# Patient Record
Sex: Female | Born: 1945 | Race: White | Hispanic: No | Marital: Married | Smoking: Current every day smoker
Health system: Southern US, Community
[De-identification: ages and names within clinical notes are randomized; demographics above are authoritative.]

## PROBLEM LIST (undated history)

## (undated) DIAGNOSIS — I10 Essential (primary) hypertension: Secondary | ICD-10-CM

## (undated) DIAGNOSIS — E039 Hypothyroidism, unspecified: Secondary | ICD-10-CM

## (undated) HISTORY — PX: CHOLECYSTECTOMY: SHX55

---

## 2011-03-17 ENCOUNTER — Ambulatory Visit: Payer: Self-pay | Admitting: Internal Medicine

## 2011-03-30 ENCOUNTER — Ambulatory Visit: Payer: Self-pay | Admitting: Internal Medicine

## 2012-04-25 ENCOUNTER — Ambulatory Visit: Payer: Self-pay | Admitting: Internal Medicine

## 2012-04-28 ENCOUNTER — Ambulatory Visit: Payer: Self-pay | Admitting: Internal Medicine

## 2012-08-08 ENCOUNTER — Ambulatory Visit: Payer: Self-pay | Admitting: Internal Medicine

## 2012-11-15 ENCOUNTER — Ambulatory Visit: Payer: Self-pay | Admitting: Unknown Physician Specialty

## 2013-05-10 ENCOUNTER — Ambulatory Visit: Payer: Self-pay | Admitting: Internal Medicine

## 2013-05-19 ENCOUNTER — Ambulatory Visit: Payer: Self-pay | Admitting: Unknown Physician Specialty

## 2013-10-25 ENCOUNTER — Ambulatory Visit: Payer: Self-pay

## 2013-11-09 ENCOUNTER — Ambulatory Visit: Payer: Self-pay

## 2014-01-24 DIAGNOSIS — E0520 Thyrotoxicosis with toxic multinodular goiter without thyrotoxic crisis or storm: Secondary | ICD-10-CM | POA: Insufficient documentation

## 2014-04-04 DIAGNOSIS — E890 Postprocedural hypothyroidism: Secondary | ICD-10-CM | POA: Insufficient documentation

## 2014-05-22 ENCOUNTER — Ambulatory Visit: Payer: Self-pay | Admitting: Internal Medicine

## 2014-11-20 ENCOUNTER — Other Ambulatory Visit: Payer: Self-pay | Admitting: Internal Medicine

## 2014-11-20 DIAGNOSIS — E0520 Thyrotoxicosis with toxic multinodular goiter without thyrotoxic crisis or storm: Secondary | ICD-10-CM

## 2014-11-27 ENCOUNTER — Other Ambulatory Visit: Payer: Self-pay | Admitting: Internal Medicine

## 2014-11-27 ENCOUNTER — Encounter
Admission: RE | Admit: 2014-11-27 | Discharge: 2014-11-27 | Disposition: A | Discharge status: Home / Self Care | Payer: Medicare Other | Source: Ambulatory Visit | Attending: Internal Medicine | Admitting: Internal Medicine

## 2014-11-27 ENCOUNTER — Ambulatory Visit
Admission: RE | Admit: 2014-11-27 | Discharge: 2014-11-27 | Disposition: A | Discharge status: Home / Self Care | Payer: Medicare Other | Source: Ambulatory Visit | Attending: Internal Medicine | Admitting: Internal Medicine

## 2014-11-27 ENCOUNTER — Ambulatory Visit: Admission: RE | Admit: 2014-11-27 | Payer: Medicare Other | Source: Ambulatory Visit

## 2014-11-27 DIAGNOSIS — E0520 Thyrotoxicosis with toxic multinodular goiter without thyrotoxic crisis or storm: Secondary | ICD-10-CM

## 2014-11-27 MED ORDER — SODIUM IODIDE I-123 7.4 MBQ PO CAPS
154.8100 | ORAL_CAPSULE | Freq: Once | ORAL | Status: AC
  Administered 2014-11-27: 154.81 via ORAL

## 2014-11-28 ENCOUNTER — Encounter
Admission: RE | Admit: 2014-11-28 | Discharge: 2014-11-28 | Disposition: A | Discharge status: Home / Self Care | Payer: Medicare Other | Source: Ambulatory Visit | Attending: Internal Medicine | Admitting: Internal Medicine

## 2014-11-30 ENCOUNTER — Encounter
Admission: RE | Admit: 2014-11-30 | Discharge: 2014-11-30 | Disposition: A | Discharge status: Home / Self Care | Payer: Medicare Other | Source: Ambulatory Visit | Attending: Internal Medicine | Admitting: Internal Medicine

## 2014-11-30 DIAGNOSIS — E0520 Thyrotoxicosis with toxic multinodular goiter without thyrotoxic crisis or storm: Secondary | ICD-10-CM | POA: Insufficient documentation

## 2014-11-30 MED ORDER — SODIUM IODIDE I 131 CAPSULE
34.9300 | Freq: Once | INTRAVENOUS | Status: AC | PRN
  Administered 2014-11-30: 34.93 via ORAL

## 2015-04-21 DEATH — deceased

## 2016-09-08 ENCOUNTER — Other Ambulatory Visit: Payer: Self-pay | Admitting: Internal Medicine

## 2016-09-08 DIAGNOSIS — Z1231 Encounter for screening mammogram for malignant neoplasm of breast: Secondary | ICD-10-CM

## 2016-10-01 ENCOUNTER — Ambulatory Visit
Admission: RE | Admit: 2016-10-01 | Discharge: 2016-10-01 | Disposition: A | Discharge status: Home / Self Care | Payer: Medicare Other | Source: Ambulatory Visit | Attending: Internal Medicine | Admitting: Internal Medicine

## 2016-10-01 DIAGNOSIS — Z1231 Encounter for screening mammogram for malignant neoplasm of breast: Secondary | ICD-10-CM | POA: Diagnosis not present

## 2017-12-22 ENCOUNTER — Other Ambulatory Visit: Payer: Self-pay | Admitting: Physician Assistant

## 2017-12-22 DIAGNOSIS — R221 Localized swelling, mass and lump, neck: Secondary | ICD-10-CM

## 2017-12-28 ENCOUNTER — Ambulatory Visit
Admission: RE | Admit: 2017-12-28 | Discharge: 2017-12-28 | Disposition: A | Discharge status: Home / Self Care | Payer: Medicare Other | Source: Ambulatory Visit | Attending: Physician Assistant | Admitting: Physician Assistant

## 2017-12-28 DIAGNOSIS — R220 Localized swelling, mass and lump, head: Secondary | ICD-10-CM | POA: Insufficient documentation

## 2017-12-28 DIAGNOSIS — R221 Localized swelling, mass and lump, neck: Secondary | ICD-10-CM

## 2017-12-28 DIAGNOSIS — D110 Benign neoplasm of parotid gland: Secondary | ICD-10-CM | POA: Insufficient documentation

## 2017-12-28 HISTORY — DX: Essential (primary) hypertension: I10

## 2017-12-28 LAB — POCT I-STAT CREATININE: Creatinine, Ser: 0.5 mg/dL (ref 0.44–1.00)

## 2017-12-28 MED ORDER — IOPAMIDOL (ISOVUE-300) INJECTION 61%
75.0000 mL | Freq: Once | INTRAVENOUS | Status: AC | PRN
  Administered 2017-12-28: 75 mL via INTRAVENOUS

## 2017-12-30 ENCOUNTER — Other Ambulatory Visit: Payer: Self-pay | Admitting: Physician Assistant

## 2017-12-30 DIAGNOSIS — D110 Benign neoplasm of parotid gland: Secondary | ICD-10-CM

## 2017-12-30 DIAGNOSIS — R220 Localized swelling, mass and lump, head: Secondary | ICD-10-CM

## 2018-01-04 ENCOUNTER — Other Ambulatory Visit: Payer: Self-pay | Admitting: Physician Assistant

## 2018-01-04 ENCOUNTER — Ambulatory Visit
Admission: RE | Admit: 2018-01-04 | Discharge: 2018-01-04 | Disposition: A | Discharge status: Home / Self Care | Payer: Medicare Other | Source: Ambulatory Visit | Attending: Physician Assistant | Admitting: Physician Assistant

## 2018-01-04 DIAGNOSIS — K118 Other diseases of salivary glands: Secondary | ICD-10-CM | POA: Insufficient documentation

## 2018-01-04 DIAGNOSIS — Z803 Family history of malignant neoplasm of breast: Secondary | ICD-10-CM | POA: Diagnosis not present

## 2018-01-04 DIAGNOSIS — F17200 Nicotine dependence, unspecified, uncomplicated: Secondary | ICD-10-CM | POA: Insufficient documentation

## 2018-01-04 DIAGNOSIS — Z79899 Other long term (current) drug therapy: Secondary | ICD-10-CM | POA: Insufficient documentation

## 2018-01-04 DIAGNOSIS — D110 Benign neoplasm of parotid gland: Secondary | ICD-10-CM | POA: Diagnosis not present

## 2018-01-04 DIAGNOSIS — I10 Essential (primary) hypertension: Secondary | ICD-10-CM | POA: Diagnosis not present

## 2018-01-04 DIAGNOSIS — R220 Localized swelling, mass and lump, head: Secondary | ICD-10-CM | POA: Diagnosis present

## 2018-01-04 MED ORDER — FENTANYL CITRATE (PF) 100 MCG/2ML IJ SOLN
INTRAMUSCULAR | Status: AC
  Filled 2018-01-04: qty 2

## 2018-01-04 MED ORDER — MIDAZOLAM HCL 5 MG/5ML IJ SOLN
INTRAMUSCULAR | Status: AC
  Filled 2018-01-04: qty 5

## 2018-01-04 MED ORDER — MIDAZOLAM HCL 2 MG/2ML IJ SOLN
INTRAMUSCULAR | Status: AC | PRN
  Administered 2018-01-04 (×2): 1 mg via INTRAVENOUS

## 2018-01-04 MED ORDER — FENTANYL CITRATE (PF) 100 MCG/2ML IJ SOLN
INTRAMUSCULAR | Status: AC | PRN
  Administered 2018-01-04: 50 ug via INTRAVENOUS
  Administered 2018-01-04: 25 ug via INTRAVENOUS

## 2018-01-04 MED ORDER — SODIUM CHLORIDE 0.9 % IV SOLN
INTRAVENOUS | Status: DC
  Administered 2018-01-04: 08:00:00 via INTRAVENOUS

## 2018-01-04 NOTE — Discharge Instructions (Signed)
Moderate Conscious Sedation, Adult, Care After °These instructions provide you with information about caring for yourself after your procedure. Your health care provider may also give you more specific instructions. Your treatment has been planned according to current medical practices, but problems sometimes occur. Call your health care provider if you have any problems or questions after your procedure. °What can I expect after the procedure? °After your procedure, it is common: °· To feel sleepy for several hours. °· To feel clumsy and have poor balance for several hours. °· To have poor judgment for several hours. °· To vomit if you eat too soon. ° °Follow these instructions at home: °For at least 24 hours after the procedure: ° °· Do not: °? Participate in activities where you could fall or become injured. °? Drive. °? Use heavy machinery. °? Drink alcohol. °? Take sleeping pills or medicines that cause drowsiness. °? Make important decisions or sign legal documents. °? Take care of children on your own. °· Rest. °Eating and drinking °· Follow the diet recommended by your health care provider. °· If you vomit: °? Drink water, juice, or soup when you can drink without vomiting. °? Make sure you have little or no nausea before eating solid foods. °General instructions °· Have a responsible adult stay with you until you are awake and alert. °· Take over-the-counter and prescription medicines only as told by your health care provider. °· If you smoke, do not smoke without supervision. °· Keep all follow-up visits as told by your health care provider. This is important. °Contact a health care provider if: °· You keep feeling nauseous or you keep vomiting. °· You feel light-headed. °· You develop a rash. °· You have a fever. °Get help right away if: °· You have trouble breathing. °This information is not intended to replace advice given to you by your health care provider. Make sure you discuss any questions you have  with your health care provider. °Document Released: 01/25/2013 Document Revised: 09/09/2015 Document Reviewed: 07/27/2015 °Elsevier Interactive Patient Education © 2018 [employer] ° ° °Needle Biopsy, Care After °These instructions give you information about caring for yourself after your procedure. Your doctor may also give you more specific instructions. Call your doctor if you have any problems or questions after your procedure. °Follow these instructions at home: °· Rest as told by your doctor. °· Take medicines only as told by your doctor. °· There are many different ways to close and cover the biopsy site, including stitches (sutures), skin glue, and adhesive strips. Follow instructions from your doctor about: °? How to take care of your biopsy site. °? When and how you should change your bandage (dressing). °? When you should remove your dressing. °? Removing whatever was used to close your biopsy site. °· Check your biopsy site every day for signs of infection. Watch for: °? Redness, swelling, or pain. °? Fluid, blood, or pus. °Contact a doctor if: °· You have a fever. °· You have redness, swelling, or pain at the biopsy site, and it lasts longer than a few days. °· You have fluid, blood, or pus coming from the biopsy site. °· You feel sick to your stomach (nauseous). °· You throw up (vomit). °Get help right away if: °· You are short of breath. °· You have trouble breathing. °· Your chest hurts. °· You feel dizzy or you pass out (faint). °· You have bleeding that does not stop with pressure or a bandage. °· You cough up blood. °·   Your belly (abdomen) hurts. °This information is not intended to replace advice given to you by your health care provider. Make sure you discuss any questions you have with your health care provider. °Document Released: 03/19/2008 Document Revised: 09/12/2015 Document Reviewed: 04/02/2014 °Elsevier Interactive Patient Education © 2018 [employer] ° °

## 2018-01-04 NOTE — Procedures (Signed)
Interventional Radiology Procedure Note  Procedure: US guided FNA of left submandibular gland mass and right parotid gland masses x 2  Complications: None  Estimated Blood Loss: < 10 mL  Findings: Left submandibular: 1.5 cm greatest diam, hypoechoic mass.  25 G FNA x 4 Right parotid #1: 1.9 cm greatest diam., hypoechoic.  25 G FNA x 4 Right parotid #2: 1.1 cm greatest diam., hypoechoic. 25 G FNA x 4.  Venetia Night. Kathlene Cote, M.D Pager:  [phone]

## 2018-01-04 NOTE — H&P (Signed)
Chief Complaint: Patient was seen in consultation today for right parotid and left submandibular salivary gland biopsy at the request of Brooks,Jennifer Ann  Referring Physician(s): Arturo Morton  Patient Status: [facility] - Out-pt  History of Present Illness: Darlene Grimes is a 72 y.o. female with a palpable left submandibular gland mass.  Also states history of palpable nodules in region of right parotid gland since about 2015.  CT of neck on 12/28/17 demonstrated 1.6 cm left submandibular gland mass and 1.2 and 1.3 cm right parotid gland nodules. Not painful or inflamed. No history of salivary calculi or surgery.  Has had prior sinus surgery. No history of malignancy.  Current smoker.  Past Medical History:  Diagnosis Date  . Hypertension     No past surgical history on file.  Allergies: Patient has no known allergies.  Medications: Prior to Admission medications   Medication Sig Start Date End Date Taking? Authorizing Provider  losartan (COZAAR) 50 MG tablet Take 50 mg by mouth daily.   Yes [provider]     Family History  Problem Relation Age of Onset  . Breast cancer Mother 82  . Breast cancer Maternal Aunt        50's    Social History   Socioeconomic History  . Marital status: Married    Spouse name: Not on file  . Number of children: Not on file  . Years of education: Not on file  . Highest education level: Not on file  Occupational History  . Not on file  Social Needs  . Financial resource strain: Not on file  . Food insecurity:    Worry: Not on file    Inability: Not on file  . Transportation needs:    Medical: Not on file    Non-medical: Not on file  Tobacco Use  . Smoking status: Current Every Day Smoker  . Smokeless tobacco: Never Used  Substance and Sexual Activity  . Alcohol use: Not on file  . Drug use: Not on file  . Sexual activity: Not on file  Lifestyle  . Physical activity:    Days per week: Not on file   Minutes per session: Not on file  . Stress: Not on file  Relationships  . Social connections:    Talks on phone: Not on file    Gets together: Not on file    Attends religious service: Not on file    Active member of club or organization: Not on file    Attends meetings of clubs or organizations: Not on file    Relationship status: Not on file  Other Topics Concern  . Not on file  Social History Narrative  . Not on file    Review of Systems: A 12 point ROS discussed and pertinent positives are indicated in the HPI above.  All other systems are negative.  Review of Systems  Constitutional: Negative.   HENT: Negative.   Respiratory: Negative.   Cardiovascular: Negative.   Gastrointestinal: Negative.   Genitourinary: Negative.   Musculoskeletal: Negative.   Neurological: Negative.     Vital Signs: BP (!) 149/68   Pulse 71   Temp 98 F (36.7 C) (Oral)   Resp (!) 71   Ht 5\' 1"  (1.549 m)   Wt 59 kg   SpO2 96%   BMI 24.56 kg/m   Physical Exam  Constitutional: She is oriented to person, place, and time. She appears well-developed and well-nourished. No distress.  HENT:  Head: Normocephalic  and atraumatic.  Neck: Neck supple. No JVD present. No tracheal deviation present. No thyromegaly present.  Palpable firm nodularity of left submandibular gland.  Difficult to palpate right parotid gland lesions.  Cardiovascular: Normal rate, regular rhythm and normal heart sounds. Exam reveals no gallop and no friction rub.  No murmur heard. Pulmonary/Chest: Effort normal and breath sounds normal. No stridor. No respiratory distress. She has no wheezes. She has no rales.  Abdominal: Soft. Bowel sounds are normal. She exhibits no distension and no mass. There is no tenderness. There is no rebound and no guarding.  Musculoskeletal: She exhibits no edema.  Lymphadenopathy:    She has no cervical adenopathy.  Neurological: She is alert and oriented to person, place, and time.  Skin: Skin  is warm and dry. She is not diaphoretic.  Vitals reviewed.   Imaging: Ct Soft Tissue Neck W Contrast  Result Date: 12/28/2017 CLINICAL DATA:  Left submandibular swelling for 2-3 weeks. Remote right parotid surgery. History of smoking. EXAM: CT NECK WITH CONTRAST TECHNIQUE: Multidetector CT imaging of the neck was performed using the standard protocol following the bolus administration of intravenous contrast. CONTRAST:  27mL ISOVUE-300 IOPAMIDOL (ISOVUE-300) INJECTION 61% COMPARISON:  None. FINDINGS: Pharynx and larynx: No mass. No retropharyngeal fluid. Salivary glands: 16 x 11 x 15 mm hypoenhancing region in the left submandibular gland most is consistent with a mass. No salivary calculi, ductal dilatation, or acute inflammatory changes are identified. Enhancing masses in the superficial aspect of the right parotid gland measure 12 x 8 mm and 13 x 12 mm with suggestion of small cystic areas in the larger mass. The right submandibular and left parotid glands are unremarkable. Thyroid: Diffusely heterogeneous thyroid gland with small calcifications bilaterally and no discrete dominant nodule. Lymph nodes: No enlarged or suspicious lymph nodes in the neck. Vascular: Thoracic aortic atherosclerosis. Average right subclavian artery. Moderate calcific atherosclerosis at the left greater than right carotid bifurcations without evidence of flow limiting ICA stenosis. Limited intracranial: Unremarkable. Visualized orbits: Bilateral cataract extraction. Mastoids and visualized paranasal sinuses: Clear. Skeleton: Mild cervical disc and facet degeneration. No suspicious osseous lesion. Upper chest: Mild centrilobular emphysema in the lung apices. Asymmetric pleural-parenchymal scarring in the left lung apex including a 1.7 x 1.0 cm nodular subpleural density (series 4, image 21). Other: None. IMPRESSION: 1. 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 2. Two right parotid masses  measuring up to 13 mm also compatible with primary salivary neoplasm, potentially Warthin's tumors given multiplicity and smoking history. 3. No evidence of cervical lymphadenopathy. 4. Asymmetric pleural-parenchymal scarring in the left lung apex including a 1.4 cm nodule. Chest CT or PET-CT is recommended in 3 months to assess stability. This recommendation follows the consensus statement: Guidelines for Management of Incidental Pulmonary Nodules Detected on CT Images: From the Fleischner Society 2017; Radiology 2017; 284:228-243. Electronically Signed   By: Logan Bores M.D.   On: 12/28/2017 11:03    Labs:  CBC: No results for input(s): WBC, HGB, HCT, PLT in the last 8760 hours.  COAGS: No results for input(s): INR, APTT in the last 8760 hours.  BMP: Recent Labs    12/28/17 0854  CREATININE 0.50    Assessment and Plan:  For US guided FNA of left submandibular and right parotid gland lesions x 2 today.  Will administer conscious sedation given unusual 3 lesion salivary biopsy which will lengthen procedural time.  Risks and benefits discussed with the patient including, but not  limited to bleeding, infection, damage to adjacent structures or low yield requiring additional tests. All of the patient's questions were answered, patient is agreeable to proceed. Consent signed and in chart.  Thank you for this interesting consult.  I greatly enjoyed meeting Aryiana Klinkner and look forward to participating in their care.  A copy of this report was sent to the requesting provider on this date.  Electronically Signed: Azzie Roup, MD 01/04/2018, 8:31 AM   I spent a total of 30 Minutes in face to face in clinical consultation, greater than 50% of which was counseling/coordinating care for bilateral salivary gland mass biopsy.

## 2018-01-06 LAB — CYTOLOGY - NON PAP

## 2018-01-12 ENCOUNTER — Other Ambulatory Visit: Payer: Self-pay | Admitting: Internal Medicine

## 2018-01-12 DIAGNOSIS — G3184 Mild cognitive impairment, so stated: Secondary | ICD-10-CM

## 2018-01-17 ENCOUNTER — Ambulatory Visit
Admission: RE | Admit: 2018-01-17 | Discharge: 2018-01-17 | Disposition: A | Discharge status: Home / Self Care | Payer: Medicare Other | Source: Ambulatory Visit | Attending: Internal Medicine | Admitting: Internal Medicine

## 2018-01-17 DIAGNOSIS — G3184 Mild cognitive impairment, so stated: Secondary | ICD-10-CM | POA: Insufficient documentation

## 2018-01-17 DIAGNOSIS — I6523 Occlusion and stenosis of bilateral carotid arteries: Secondary | ICD-10-CM | POA: Diagnosis not present

## 2018-01-26 ENCOUNTER — Encounter
Admission: RE | Admit: 2018-01-26 | Discharge: 2018-01-26 | Disposition: A | Discharge status: Home / Self Care | Payer: Medicare Other | Source: Ambulatory Visit | Attending: Unknown Physician Specialty | Admitting: Unknown Physician Specialty

## 2018-01-26 ENCOUNTER — Other Ambulatory Visit: Payer: Self-pay

## 2018-01-26 DIAGNOSIS — I10 Essential (primary) hypertension: Secondary | ICD-10-CM | POA: Diagnosis not present

## 2018-01-26 DIAGNOSIS — R9431 Abnormal electrocardiogram [ECG] [EKG]: Secondary | ICD-10-CM | POA: Diagnosis not present

## 2018-01-26 DIAGNOSIS — Z01818 Encounter for other preprocedural examination: Secondary | ICD-10-CM | POA: Diagnosis present

## 2018-01-26 HISTORY — DX: Hypothyroidism, unspecified: E03.9

## 2018-01-26 LAB — BASIC METABOLIC PANEL
Anion gap: 10 (ref 5–15)
BUN: 6 mg/dL — AB (ref 8–23)
CO2: 31 mmol/L (ref 22–32)
CREATININE: 0.62 mg/dL (ref 0.44–1.00)
Calcium: 9 mg/dL (ref 8.9–10.3)
Chloride: 92 mmol/L — ABNORMAL LOW (ref 98–111)
GFR calc Af Amer: >60 mL/min (ref >60)
Glucose, Bld: 84 mg/dL (ref 70–99)
Potassium: 3 mmol/L — ABNORMAL LOW (ref 3.5–5.1)
SODIUM: 133 mmol/L — AB (ref 135–145)

## 2018-01-26 LAB — CBC
HCT: 44.8 % (ref 36.0–46.0)
Hemoglobin: 15.7 g/dL — ABNORMAL HIGH (ref 12.0–15.0)
MCH: 32.3 pg (ref 26.0–34.0)
MCHC: 35 g/dL (ref 30.0–36.0)
MCV: 92.2 fL (ref 80.0–100.0)
PLATELETS: 307 10*3/uL (ref 150–400)
RBC: 4.86 MIL/uL (ref 3.87–5.11)
RDW: 13.2 % (ref 11.5–15.5)
WBC: 7.7 10*3/uL (ref 4.0–10.5)
nRBC: 0 % (ref 0.0–0.2)

## 2018-01-26 NOTE — Patient Instructions (Addendum)
  Your procedure is scheduled on: Tuesday February 01, 2018 Report to Same Day Surgery 2nd floor medical mall ([place] Entrance-take elevator on left to 2nd floor.  Check in with surgery information desk.) To find out your arrival time please call [phone] between 1PM - 3PM on Monday January 31, 2018  Remember: Instructions that are not followed completely may result in serious medical risk, up to and including death, or upon the discretion of your surgeon and anesthesiologist your surgery may need to be rescheduled.    _x___ 1. Do not eat food (including mints, candies, chewing gum) after midnight the night before your procedure. You may drink clear liquids up to 2 hours before you are scheduled to arrive at the hospital for your procedure.  Do not drink clear liquids within 2 hours of your scheduled arrival to the hospital.  Clear liquids include  --Water or Apple juice without pulp  --Clear carbohydrate beverage such as Gatorade  --Black Coffee or Clear Tea (No milk, no creamers, do not add anything to the coffee or tea)    __x__ 2. No Alcohol for 24 hours before or after surgery.   __x__ 3. No Smoking or e-cigarettes for 24 prior to surgery.  Do not use any chewable tobacco products for at least 6 hour prior to surgery   __x__ 4. Notify your doctor if there is any change in your medical condition (cold, fever, infections).   __x__ 5. On the morning of surgery brush your teeth with toothpaste and water.  You may rinse your mouth with mouth wash if you wish.  Do not swallow any toothpaste or mouthwash.   __x__ Use antibacterial soap such as Dial to shower/bathe on the day of surgery.   Do not wear jewelry, make-up, hairpins, clips or nail polish.  Do not wear lotions, powders, deodorant, or perfumes.   Do not shave below the face/neck 48 hours prior to surgery.   Do not bring valuables to the hospital.    [facility] is not responsible for any belongings or valuables.           Contacts, dentures or bridgework may not be worn into surgery.  For patients discharged on the day of surgery, you will NOT be permitted to drive yourself home.  _x___ Take anti-hypertensive listed below, cardiac, seizure, asthma, anti-reflux and psychiatric medicines. These include:  1. Levothyroxine (Synthroid)    Do NOT take your Losartan-HCTZ (Hyzaar) on the morning of surgery.  _x___ Follow recommendations from Cardiologist, Pulmonologist or PCP regarding stopping Aspirin, Coumadin, Plavix ,Eliquis, Effient, or Pradaxa, and Pletal.  _x___ NOW: Stop Anti-inflammatories such as Advil, Ibuprofen, Motrin, Naproxen, Naprosyn, Aleve, Goodies powders or aspirin products. OK to take Tylenol and Celebrex.   _x___ NOW: Stop supplements until after surgery.  But may continue Vitamin D, Vitamin B, and multivitamin.

## 2018-01-26 NOTE — Pre-Procedure Instructions (Signed)
BMP results faxed to Dr. Ileene Hutchinson office.  I-stat ordered for DOS.

## 2018-01-26 NOTE — Care Management (Signed)
EKG REVIEWED.  Poor R wave progression, otherwise OK. No cardiac symptoms. No further testing required.

## 2018-02-01 ENCOUNTER — Ambulatory Visit
Admission: RE | Admit: 2018-02-01 | Discharge: 2018-02-01 | Disposition: A | Discharge status: Home / Self Care | Payer: Medicare Other | Source: Ambulatory Visit | Attending: Unknown Physician Specialty | Admitting: Unknown Physician Specialty

## 2018-02-01 ENCOUNTER — Encounter: Payer: Self-pay | Admitting: *Deleted

## 2018-02-01 ENCOUNTER — Ambulatory Visit: Payer: Medicare Other | Admitting: Anesthesiology

## 2018-02-01 ENCOUNTER — Encounter
Admission: RE | Disposition: A | Discharge status: Home / Self Care | Payer: Self-pay | Source: Ambulatory Visit | Attending: Unknown Physician Specialty

## 2018-02-01 ENCOUNTER — Other Ambulatory Visit: Payer: Self-pay

## 2018-02-01 DIAGNOSIS — I10 Essential (primary) hypertension: Secondary | ICD-10-CM | POA: Diagnosis not present

## 2018-02-01 DIAGNOSIS — J449 Chronic obstructive pulmonary disease, unspecified: Secondary | ICD-10-CM | POA: Diagnosis not present

## 2018-02-01 DIAGNOSIS — E039 Hypothyroidism, unspecified: Secondary | ICD-10-CM | POA: Insufficient documentation

## 2018-02-01 DIAGNOSIS — C080 Malignant neoplasm of submandibular gland: Secondary | ICD-10-CM | POA: Insufficient documentation

## 2018-02-01 DIAGNOSIS — K219 Gastro-esophageal reflux disease without esophagitis: Secondary | ICD-10-CM | POA: Insufficient documentation

## 2018-02-01 DIAGNOSIS — F17200 Nicotine dependence, unspecified, uncomplicated: Secondary | ICD-10-CM | POA: Insufficient documentation

## 2018-02-01 DIAGNOSIS — Z79899 Other long term (current) drug therapy: Secondary | ICD-10-CM | POA: Insufficient documentation

## 2018-02-01 DIAGNOSIS — R221 Localized swelling, mass and lump, neck: Secondary | ICD-10-CM

## 2018-02-01 HISTORY — PX: SUBMANDIBULAR GLAND EXCISION: SHX2456

## 2018-02-01 LAB — POCT I-STAT 4, (NA,K, GLUC, HGB,HCT)
Glucose, Bld: 96 mg/dL (ref 70–99)
HCT: 46 % (ref 36.0–46.0)
HEMOGLOBIN: 15.6 g/dL — AB (ref 12.0–15.0)
Potassium: 3.5 mmol/L (ref 3.5–5.1)
Sodium: 134 mmol/L — ABNORMAL LOW (ref 135–145)

## 2018-02-01 SURGERY — EXCISION, SUBMANDIBULAR GLAND
Anesthesia: General | Laterality: Left

## 2018-02-01 MED ORDER — HYDROCODONE-ACETAMINOPHEN 5-300 MG PO TABS: 1.0000 | ORAL_TABLET | ORAL | 0 refills | Status: DC | PRN

## 2018-02-01 MED ORDER — ONDANSETRON HCL 4 MG/2ML IJ SOLN
INTRAMUSCULAR | Status: DC | PRN
  Administered 2018-02-01: 4 mg via INTRAVENOUS

## 2018-02-01 MED ORDER — FENTANYL CITRATE (PF) 100 MCG/2ML IJ SOLN
INTRAMUSCULAR | Status: DC | PRN
  Administered 2018-02-01: 100 ug via INTRAVENOUS

## 2018-02-01 MED ORDER — LACTATED RINGERS IV SOLN
INTRAVENOUS | Status: DC
  Administered 2018-02-01: 07:00:00 via INTRAVENOUS

## 2018-02-01 MED ORDER — LIDOCAINE-EPINEPHRINE 1 %-1:100000 IJ SOLN
INTRAMUSCULAR | Status: AC
  Filled 2018-02-01: qty 1

## 2018-02-01 MED ORDER — PROPOFOL 10 MG/ML IV BOLUS
INTRAVENOUS | Status: DC | PRN
  Administered 2018-02-01: 100 mg via INTRAVENOUS

## 2018-02-01 MED ORDER — REMIFENTANIL HCL 1 MG IV SOLR
INTRAVENOUS | Status: AC
  Filled 2018-02-01: qty 1000

## 2018-02-01 MED ORDER — FENTANYL CITRATE (PF) 100 MCG/2ML IJ SOLN: 25.0000 ug | INTRAMUSCULAR | Status: DC | PRN

## 2018-02-01 MED ORDER — SEVOFLURANE IN SOLN
RESPIRATORY_TRACT | Status: AC
  Filled 2018-02-01: qty 250

## 2018-02-01 MED ORDER — PROPOFOL 10 MG/ML IV BOLUS
INTRAVENOUS | Status: AC
  Filled 2018-02-01: qty 20

## 2018-02-01 MED ORDER — LIDOCAINE-EPINEPHRINE 1 %-1:100000 IJ SOLN
INTRAMUSCULAR | Status: DC | PRN
  Administered 2018-02-01: 2.5 mL

## 2018-02-01 MED ORDER — ONDANSETRON HCL 4 MG/2ML IJ SOLN
INTRAMUSCULAR | Status: AC
  Filled 2018-02-01: qty 2

## 2018-02-01 MED ORDER — FAMOTIDINE 20 MG PO TABS
20.0000 mg | ORAL_TABLET | Freq: Once | ORAL | Status: AC
  Administered 2018-02-01: 20 mg via ORAL

## 2018-02-01 MED ORDER — FENTANYL CITRATE (PF) 100 MCG/2ML IJ SOLN
INTRAMUSCULAR | Status: AC
  Filled 2018-02-01: qty 2

## 2018-02-01 MED ORDER — SODIUM CHLORIDE 0.9 % IV SOLN
INTRAVENOUS | Status: DC | PRN
  Administered 2018-02-01: .1 ug/kg/min via INTRAVENOUS

## 2018-02-01 MED ORDER — SUCCINYLCHOLINE CHLORIDE 20 MG/ML IJ SOLN
INTRAMUSCULAR | Status: DC | PRN
  Administered 2018-02-01: 100 mg via INTRAVENOUS

## 2018-02-01 MED ORDER — EPHEDRINE SULFATE 50 MG/ML IJ SOLN
INTRAMUSCULAR | Status: DC | PRN
  Administered 2018-02-01: 10 mg via INTRAVENOUS
  Administered 2018-02-01: 20 mg via INTRAVENOUS

## 2018-02-01 MED ORDER — BACITRACIN ZINC 500 UNIT/GM EX OINT
TOPICAL_OINTMENT | CUTANEOUS | Status: AC
  Filled 2018-02-01: qty 28.35

## 2018-02-01 MED ORDER — MIDAZOLAM HCL 2 MG/2ML IJ SOLN
INTRAMUSCULAR | Status: DC | PRN
  Administered 2018-02-01: 1 mg via INTRAVENOUS

## 2018-02-01 MED ORDER — DEXAMETHASONE SODIUM PHOSPHATE 10 MG/ML IJ SOLN
INTRAMUSCULAR | Status: DC | PRN
  Administered 2018-02-01: 10 mg via INTRAVENOUS

## 2018-02-01 MED ORDER — PHENYLEPHRINE HCL 10 MG/ML IJ SOLN
INTRAMUSCULAR | Status: DC | PRN
  Administered 2018-02-01: 100 ug via INTRAVENOUS

## 2018-02-01 MED ORDER — OXYCODONE HCL 5 MG PO TABS: 5.0000 mg | ORAL_TABLET | Freq: Once | ORAL | Status: DC | PRN

## 2018-02-01 MED ORDER — OXYCODONE HCL 5 MG/5ML PO SOLN: 5.0000 mg | Freq: Once | ORAL | Status: DC | PRN

## 2018-02-01 MED ORDER — MIDAZOLAM HCL 2 MG/2ML IJ SOLN
INTRAMUSCULAR | Status: AC
  Filled 2018-02-01: qty 2

## 2018-02-01 MED ORDER — DEXAMETHASONE SODIUM PHOSPHATE 10 MG/ML IJ SOLN
INTRAMUSCULAR | Status: AC
  Filled 2018-02-01: qty 1

## 2018-02-01 MED ORDER — FAMOTIDINE 20 MG PO TABS
ORAL_TABLET | ORAL | Status: AC
  Filled 2018-02-01: qty 1

## 2018-02-01 SURGICAL SUPPLY — 48 items
BLADE SURG 15 STRL LF DISP TIS (BLADE) ×1 IMPLANT
BLADE SURG 15 STRL SS (BLADE) ×2
CANISTER SUCT 1200ML W/VALVE (MISCELLANEOUS) ×3 IMPLANT
CORD BIP STRL DISP 12FT (MISCELLANEOUS) ×3 IMPLANT
COVER WAND RF STERILE (DRAPES) ×3 IMPLANT
DERMABOND ADVANCED (GAUZE/BANDAGES/DRESSINGS) ×2
DERMABOND ADVANCED .7 DNX12 (GAUZE/BANDAGES/DRESSINGS) ×1 IMPLANT
DRAIN TLS ROUND 10FR (DRAIN) IMPLANT
DRAPE MAG INST 16X20 L/F (DRAPES) ×3 IMPLANT
DRSG TEGADERM 2-3/8X2-3/4 SM (GAUZE/BANDAGES/DRESSINGS) ×6 IMPLANT
ELECT CAUTERY BLADE TIP 2.5 (TIP) ×3
ELECT EMG 20MM DUAL (MISCELLANEOUS) ×3
ELECT NEEDLE 20X.3 GREEN (MISCELLANEOUS) ×3
ELECT REM PT RETURN 9FT ADLT (ELECTROSURGICAL) ×3
ELECTRODE CAUTERY BLDE TIP 2.5 (TIP) IMPLANT
ELECTRODE EMG 20MM DUAL (MISCELLANEOUS) ×1 IMPLANT
ELECTRODE NDL 20X.3 GREEN (MISCELLANEOUS) ×1 IMPLANT
ELECTRODE NEEDLE 20X.3 GREEN (MISCELLANEOUS) ×1 IMPLANT
ELECTRODE REM PT RTRN 9FT ADLT (ELECTROSURGICAL) ×1 IMPLANT
FORCEPS JEWEL BIP 4-3/4 STR (INSTRUMENTS) ×3 IMPLANT
GAUZE 4X4 16PLY RFD (DISPOSABLE) ×3 IMPLANT
GLOVE BIO SURGEON STRL SZ7.5 (GLOVE) ×3 IMPLANT
GOWN STRL REUS W/ TWL LRG LVL3 (GOWN DISPOSABLE) ×3 IMPLANT
GOWN STRL REUS W/TWL LRG LVL3 (GOWN DISPOSABLE) ×6
HEMOSTAT SURGICEL 2X3 (HEMOSTASIS) ×3 IMPLANT
HOOK STAY BLUNT/RETRACTOR 5M (MISCELLANEOUS) IMPLANT
JACKSON PRATT 7MM (INSTRUMENTS) IMPLANT
LABEL OR SOLS (LABEL) ×3 IMPLANT
MARKER SKIN DUAL TIP RULER LAB (MISCELLANEOUS) ×3 IMPLANT
NS IRRIG 500ML POUR BTL (IV SOLUTION) ×3 IMPLANT
PACK HEAD/NECK (MISCELLANEOUS) ×3 IMPLANT
PROBE MONO 100X0.75 ELECT 1.9M (MISCELLANEOUS) ×3 IMPLANT
SHEARS HARMONIC 9CM CVD (BLADE) ×3 IMPLANT
SPONGE KITTNER 5P (MISCELLANEOUS) ×3 IMPLANT
STAPLER SKIN PROX 35W (STAPLE) ×3 IMPLANT
SUT ETHILON 4-0 (SUTURE) ×2
SUT ETHILON 4-0 FS2 18XMFL BLK (SUTURE) ×1
SUT SILK 0 (SUTURE) ×2
SUT SILK 0 30XBRD TIE 6 (SUTURE) ×1 IMPLANT
SUT SILK 2 0 (SUTURE) ×2
SUT SILK 2 0 SH (SUTURE) ×3 IMPLANT
SUT SILK 2-0 18XBRD TIE 12 (SUTURE) ×1 IMPLANT
SUT SILK 3 0 (SUTURE) ×2
SUT SILK 3-0 18XBRD TIE 12 (SUTURE) ×1 IMPLANT
SUT VIC AB 4-0 RB1 18 (SUTURE) ×6 IMPLANT
SUTURE ETHLN 4-0 FS2 18XMF BLK (SUTURE) ×1 IMPLANT
SYR 3ML LL SCALE MARK (SYRINGE) ×2 IMPLANT
SYSTEM CHEST DRAIN TLS 7FR (DRAIN) ×2 IMPLANT

## 2018-02-01 NOTE — Op Note (Signed)
02/01/2018  8:18 AM    Darlene Grimes  [id number]   Pre-Op Dx: LEFT SUMANDIBULAR GLAND MASS  Post-op Dx: SAME  Proc: Excision left submandibular gland, facial nerve monitoring 1 hour  Surg:  Roena Malady   Assistant: Vaught  Anes:  GOT  EBL: Less than 10 cc  Comp: None  Findings: Firm mass posterior aspect submandibular gland  Procedure: [name] was identified in the holding area taken to the operating room placed in supine position.  After general endotracheal anesthesia the table was turned 90 degrees.  A facial nerve monitor was applied to the lower lip and remained on throughout the procedure.  The left neck was then prepped and draped in sterile fashion.  Incision line was marked in a natural skin crease.  This was approximately 2 fingerbreadths below the angle of mandible.  Local anesthetic of 1% lidocaine with 1 100,000 units of epinephrine was used to inject along the incision line a total of 2 and half cc was used.  With the neck prepped and draped sterilely 15 blade was used to incise down to and through the platysma muscle.  The marginal branch of the facial nerve was identified stimulated and remained intact throughout the procedure.  Hemostasis was achieved using the Bovie cautery and the bipolar.  The submandibular gland was easily identified a firm mass approximately 2-1/2 cm in the posterior aspect of the gland.  Careful dissection was performed around the gland being the capsule intact.  The harmonic scalpel was used for this dissection.  The mylohyoid muscle was identified anteriorly and retracted superiorly lingual nerve was identified and the sub-mandibular ganglion was divided using harmonic scalpel this allowed the lingual nerve to retract superiorly.  The serial branch which came from the facial artery into the gland was divided between 2 hemostats and sutured using 2-0 silk suture ligature.  The gland was then dissected anteriorly the submandibular duct traced  under the mylohyoid muscle this was traced out beneath the muscle divided between hemostats and suture ligated using 2-0 silk.  Gland was thus removed in its entirety.  There was no other adenopathy noted.  The wound was then copiously irrigated with saline no active bleeding.  Surgicel was then placed in the wound bed and a #7 TLS drain was brought by the wound inferiorly.  The platysma muscle was closed using interrupted 4-0 Vicryl subcutaneous tissues were closed using 4-0 Vicryl and the skin was closed using Dermabond.  A Tegaderm was used to affix the drain.  Patient was then returned to anesthesia where she was awakened in the operating take care of him in stable condition.  Cultures: None  Specimens: Left submandibular gland  Dispo:   Good  Plan: Discharged home with drain intact follow-up 1 day  Roena Malady  02/01/2018 8:18 AM

## 2018-02-01 NOTE — Anesthesia Procedure Notes (Signed)
Procedure Name: Intubation Date/Time: 02/01/2018 7:18 AM Performed by: Jonna Clark, CRNA Pre-anesthesia Checklist: Patient identified, Patient being monitored, Timeout performed, Emergency Drugs available and Suction available Patient Re-evaluated:Patient Re-evaluated prior to induction Oxygen Delivery Method: Circle system utilized Preoxygenation: Pre-oxygenation with 100% oxygen Induction Type: IV induction Ventilation: Mask ventilation without difficulty Laryngoscope Size: Miller and 2 Grade View: Grade I Tube type: Oral Tube size: 7.0 mm Number of attempts: 1 Airway Equipment and Method: Stylet Placement Confirmation: ETT inserted through vocal cords under direct vision,  positive ETCO2 and breath sounds checked- equal and bilateral Secured at: 21 cm Tube secured with: Tape Dental Injury: Teeth and Oropharynx as per pre-operative assessment

## 2018-02-01 NOTE — Anesthesia Preprocedure Evaluation (Signed)
Anesthesia Evaluation  Patient identified by MRN, date of birth, ID band Patient awake    Reviewed: Allergy & Precautions, H&P , NPO status , Patient's Chart, lab work & pertinent test results  History of Anesthesia Complications Negative for: history of anesthetic complications  Airway Mallampati: III  TM Distance: <3 FB Neck ROM: limited    Dental  (+) Chipped, Poor Dentition, Missing, Upper Dentures   Pulmonary neg shortness of breath, COPD, Current Smoker,           Cardiovascular Exercise Tolerance: Good hypertension, (-) angina(-) Past MI and (-) DOE      Neuro/Psych negative neurological ROS  negative psych ROS   GI/Hepatic negative GI ROS, Neg liver ROS, neg GERD  ,  Endo/Other  Hypothyroidism   Renal/GU      Musculoskeletal   Abdominal   Peds  Hematology negative hematology ROS (+)   Anesthesia Other Findings Past Medical History: No date: Hypertension No date: Hypothyroidism  Past Surgical History: No date: CHOLECYSTECTOMY  BMI    Body Mass Index:  24.56 kg/m      Reproductive/Obstetrics negative OB ROS                             Anesthesia Physical Anesthesia Plan  ASA: III  Anesthesia Plan: General ETT   Post-op Pain Management:    Induction: Intravenous  PONV Risk Score and Plan: Ondansetron, Dexamethasone, Midazolam and Treatment may vary due to age or medical condition  Airway Management Planned: Oral ETT  Additional Equipment:   Intra-op Plan:   Post-operative Plan: Extubation in OR  Informed Consent: I have reviewed the patients History and Physical, chart, labs and discussed the procedure including the risks, benefits and alternatives for the proposed anesthesia with the patient or authorized representative who has indicated his/her understanding and acceptance.   Dental Advisory Given  Plan Discussed with: Anesthesiologist, CRNA and  Surgeon  Anesthesia Plan Comments: (Patient consented for risks of anesthesia including but not limited to:  - adverse reactions to medications - damage to teeth, lips or other oral mucosa - sore throat or hoarseness - Damage to heart, brain, lungs or loss of life  Patient voiced understanding.)        Anesthesia Quick Evaluation

## 2018-02-01 NOTE — Transfer of Care (Signed)
Immediate Anesthesia Transfer of Care Note  Patient: Darlene Grimes  Procedure(s) Performed: EXCISION SUBMANDIBULAR GLAND (Left )  Patient Location: PACU  Anesthesia Type:General  Level of Consciousness: awake, alert  and oriented  Airway & Oxygen Therapy: Patient Spontanous Breathing and Patient connected to nasal cannula oxygen  Post-op Assessment: Report given to RN and Post -op Vital signs reviewed and stable  Post vital signs: Reviewed and stable  Last Vitals:  Vitals Value Taken Time  BP 156/67 02/01/2018  8:27 AM  Temp 36.5 C 02/01/2018  8:24 AM  Pulse 99 02/01/2018  8:28 AM  Resp 17 02/01/2018  8:28 AM  SpO2 100 % 02/01/2018  8:28 AM  Vitals shown include unvalidated device data.  Last Pain:  Vitals:   02/01/18 0824  TempSrc:   PainSc: Asleep         Complications: No apparent anesthesia complications

## 2018-02-01 NOTE — Anesthesia Postprocedure Evaluation (Signed)
Anesthesia Post Note  Patient: Darlene Grimes  Procedure(s) Performed: EXCISION SUBMANDIBULAR GLAND (Left )  Patient location during evaluation: PACU Anesthesia Type: General Level of consciousness: awake and alert Pain management: pain level controlled Vital Signs Assessment: post-procedure vital signs reviewed and stable Respiratory status: spontaneous breathing, nonlabored ventilation, respiratory function stable and patient connected to nasal cannula oxygen Cardiovascular status: blood pressure returned to baseline and stable Postop Assessment: no apparent nausea or vomiting Anesthetic complications: no     Last Vitals:  Vitals:   02/01/18 0856 02/01/18 0904  BP: (!) 167/94 (!) 157/74  Pulse: 98   Resp: 16   Temp: 36.4 C   SpO2: 95%     Last Pain:  Vitals:   02/01/18 0856  TempSrc:   PainSc: 0-No pain                 Precious Haws Piscitello

## 2018-02-01 NOTE — H&P (Signed)
The patient's history has been reviewed, patient examined, no change in status, stable for surgery.  Questions were answered to the patients satisfaction.  

## 2018-02-01 NOTE — Anesthesia Post-op Follow-up Note (Signed)
Anesthesia QCDR form completed.        

## 2018-02-01 NOTE — Discharge Instructions (Signed)
AMBULATORY SURGERY  °DISCHARGE INSTRUCTIONS ° ° °1) The drugs that you were given will stay in your system until tomorrow so for the next 24 hours you should not: ° °A) Drive an automobile °B) Make any legal decisions °C) Drink any alcoholic beverage ° ° °2) You may resume regular meals tomorrow.  Today it is better to start with liquids and gradually work up to solid foods. ° °You may eat anything you prefer, but it is better to start with liquids, then soup and crackers, and gradually work up to solid foods. ° ° °3) Please notify your doctor immediately if you have any unusual bleeding, trouble breathing, redness and pain at the surgery site, drainage, fever, or pain not relieved by medication. ° ° ° °4) Additional Instructions: ° ° ° ° ° ° ° °Please contact your physician with any problems or Same Day Surgery at [phone], Monday through Friday 6 am to 4 pm, or [place] [facility] [place] Main number at [phone]. °

## 2018-02-10 ENCOUNTER — Other Ambulatory Visit: Payer: Self-pay | Admitting: Unknown Physician Specialty

## 2018-02-10 LAB — SURGICAL PATHOLOGY

## 2018-02-16 ENCOUNTER — Inpatient Hospital Stay: Payer: Medicare Other | Attending: Oncology | Admitting: Oncology

## 2018-02-16 ENCOUNTER — Encounter (INDEPENDENT_AMBULATORY_CARE_PROVIDER_SITE_OTHER): Payer: Self-pay

## 2018-02-16 ENCOUNTER — Encounter: Payer: Self-pay | Admitting: Oncology

## 2018-02-16 ENCOUNTER — Other Ambulatory Visit: Payer: Self-pay

## 2018-02-16 DIAGNOSIS — C089 Malignant neoplasm of major salivary gland, unspecified: Secondary | ICD-10-CM

## 2018-02-16 DIAGNOSIS — Z720 Tobacco use: Secondary | ICD-10-CM | POA: Diagnosis not present

## 2018-02-16 DIAGNOSIS — Z85818 Personal history of malignant neoplasm of other sites of lip, oral cavity, and pharynx: Secondary | ICD-10-CM

## 2018-02-16 NOTE — Progress Notes (Signed)
Patient here for her initial visit. She is a referral from Dr. Con Memos.

## 2018-02-17 ENCOUNTER — Other Ambulatory Visit: Payer: Medicare Other

## 2018-02-17 NOTE — Progress Notes (Signed)
Tumor Board Documentation  Darlene Grimes was presented by Dr Tami Ribas at our Tumor Board on 02/17/2018, which included representatives from medical oncology, radiation oncology, surgical oncology, surgical, radiology, pathology, navigation, internal medicine, research, pulmonology.  Darlene Grimes currently presents as a new patient with history of the following treatments: none.  Additionally, we reviewed previous medical and familial history, history of present illness, and recent lab results along with all available histopathologic and imaging studies. The tumor board considered available treatment options and made the following recommendations: Additional observation    The following procedures/referrals were also placed: No orders of the defined types were placed in this encounter.   Clinical Trial Status: not eligible   Staging used:    National site-specific guidelines   were discussed with respect to the case.  Tumor board is a meeting of clinicians from various specialty areas who evaluate and discuss patients for whom a multidisciplinary approach is being considered. Final determinations in the plan of care are those of the provider(s). The responsibility for follow up of recommendations given during tumor board is that of the provider.   Today's extended care, comprehensive team conference, Darlene Grimes was not present for the discussion and was not examined.

## 2018-02-18 DIAGNOSIS — C089 Malignant neoplasm of major salivary gland, unspecified: Secondary | ICD-10-CM | POA: Insufficient documentation

## 2018-02-18 NOTE — Progress Notes (Signed)
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  INTERVAL HISTORY: Patient is a 72 year old female who recently underwent surgical resection of the salivary gland mass revealing the above-stated pathology.  She is anxious, but otherwise feels well.  She has no neurologic complaints.  She denies any recent fevers or illnesses.  She is a good appetite and denies weight loss.  She has no chest pain or shortness of breath.  She denies any nausea, vomiting, constipation, or diarrhea.  She has no urinary complaints.  Patient feels at her baseline offers no further specific complaints today.  REVIEW OF SYSTEMS:   Review of Systems  Constitutional: Negative.  Negative for fever, malaise/fatigue and weight loss.  HENT: Negative.  Negative for sore throat.   Respiratory: Negative.  Negative for cough, hemoptysis and shortness of breath.   Cardiovascular: Negative.  Negative for chest pain and leg swelling.  Gastrointestinal: Negative.  Negative for abdominal pain.  Genitourinary: Negative.  Negative for dysuria.  Musculoskeletal: Negative.  Negative for back pain.  Skin: Negative.  Negative for rash.  Neurological: Negative.  Negative for focal weakness, weakness and headaches.  Psychiatric/Behavioral: The patient is nervous/anxious.     As per HPI. Otherwise, a complete review of systems is negative.  PAST MEDICAL HISTORY: Past Medical History:  Diagnosis Date  . Hypertension   . Hypothyroidism     PAST SURGICAL HISTORY: Past Surgical History:  Procedure Laterality Date  . CHOLECYSTECTOMY    . SUBMANDIBULAR GLAND EXCISION Left 02/01/2018   Procedure: EXCISION SUBMANDIBULAR GLAND;  Surgeon: Beverly Gust, MD;  Location: [facility] ORS;  Service:  ENT;  Laterality: Left;    FAMILY HISTORY: Family History  Problem Relation Age of Onset  . Breast cancer Mother 3  . Breast cancer Maternal Aunt        50's    ADVANCED DIRECTIVES (Y/N):  N  HEALTH MAINTENANCE: Social History   Tobacco Use  . Smoking status: Current Every Day Smoker    Packs/day: 0.50    Years: 56.00    Pack years: 28.00    Types: Cigarettes  . Smokeless tobacco: Never Used  Substance Use Topics  . Alcohol use: Never    Frequency: Never  . Drug use: Never     Colonoscopy:  PAP:  Bone density:  Lipid panel:  No Known Allergies  Current Outpatient Medications  Medication Sig Dispense Refill  . ibuprofen (ADVIL,MOTRIN) 200 MG tablet Take 400 mg by mouth every 6 (six) hours as needed for headache or moderate pain.    Marland Kitchen levothyroxine (SYNTHROID, LEVOTHROID) 75 MCG tablet Take 75 mcg by mouth daily before breakfast.   5  . losartan-hydrochlorothiazide (HYZAAR) 50-12.5 MG tablet Take 1 tablet by mouth daily.  5  . potassium chloride (K-DUR) 10 MEQ tablet Take 10 mEq by mouth 2 (two) times daily.  4   No current facility-administered medications for this visit.     OBJECTIVE: Vitals:   02/16/18 1105 02/16/18 1112  BP:  (!) 147/85  Pulse:  84  Resp: 16   Temp:  98.1 F (36.7 C)     Body mass index is 24.02 kg/m.    ECOG FS:0 - Asymptomatic  General: Well-developed, well-nourished, no acute distress. Eyes: Pink conjunctiva, anicteric sclera. HEENT: Normocephalic, moist mucous membranes, clear  oropharnyx.  Well-healing surgical scar.  No palpable lymphadenopathy. Lungs: Clear to auscultation bilaterally. Heart: Regular rate and rhythm. No rubs, murmurs, or gallops. Abdomen: Soft, nontender, nondistended. No organomegaly noted, normoactive bowel sounds. Musculoskeletal: No edema, cyanosis, or clubbing. Neuro: Alert, answering all questions appropriately. Cranial nerves grossly intact. Skin: No rashes or petechiae noted. Psych: Normal  affect. Lymphatics: No cervical, calvicular, axillary or inguinal LAD.   LAB RESULTS:  Lab Results  Component Value Date   NA 134 (L) 02/01/2018   K 3.5 02/01/2018   CL 92 (L) 01/26/2018   CO2 31 01/26/2018   GLUCOSE 96 02/01/2018   BUN 6 (L) 01/26/2018   CREATININE 0.62 01/26/2018   CALCIUM 9.0 01/26/2018   GFRNONAA >60 01/26/2018   GFRAA >60 01/26/2018    Lab Results  Component Value Date   WBC 7.7 01/26/2018   HGB 15.6 (H) 02/01/2018   HCT 46.0 02/01/2018   MCV 92.2 01/26/2018   PLT 307 01/26/2018     STUDIES: No results found.  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  PLAN:    1. 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  Patient had a complete resection with negative margins.  Per NCCN guidelines, no further treatment is necessary.  If patient had recurrent disease, could consider XRT at that time.  After lengthy discussion with the patient and her daughter, it was agreed upon that no further follow-up is necessary and routine follow-up can be continued by ENT.  I spent a total of 60 minutes face-to-face with the patient of which greater than 50% of the visit was spent in counseling and coordination of care as detailed above.   Patient expressed understanding and was in agreement with this plan. She also understands that She can call clinic at any time with any questions, concerns, or complaints.   Cancer Staging No matching staging information was found for the patient.  Lloyd Huger, MD   02/18/2018 2:57 PM

## 2018-04-27 ENCOUNTER — Other Ambulatory Visit: Payer: Self-pay | Admitting: Neurology

## 2018-04-27 DIAGNOSIS — R413 Other amnesia: Secondary | ICD-10-CM

## 2018-04-29 ENCOUNTER — Other Ambulatory Visit (HOSPITAL_COMMUNITY): Payer: Self-pay | Admitting: Internal Medicine

## 2018-04-29 ENCOUNTER — Other Ambulatory Visit: Payer: Self-pay | Admitting: Internal Medicine

## 2018-04-29 DIAGNOSIS — R911 Solitary pulmonary nodule: Secondary | ICD-10-CM

## 2018-05-04 ENCOUNTER — Ambulatory Visit
Admission: RE | Admit: 2018-05-04 | Discharge: 2018-05-04 | Disposition: A | Discharge status: Home / Self Care | Payer: Medicare Other | Source: Ambulatory Visit | Attending: Internal Medicine | Admitting: Internal Medicine

## 2018-05-04 DIAGNOSIS — R911 Solitary pulmonary nodule: Secondary | ICD-10-CM | POA: Diagnosis not present

## 2018-05-04 LAB — POCT I-STAT CREATININE: Creatinine, Ser: 0.6 mg/dL (ref 0.44–1.00)

## 2018-05-04 MED ORDER — IOPAMIDOL (ISOVUE-300) INJECTION 61%
60.0000 mL | Freq: Once | INTRAVENOUS | Status: AC | PRN
  Administered 2018-05-04: 60 mL via INTRAVENOUS

## 2018-05-07 ENCOUNTER — Ambulatory Visit
Admission: RE | Admit: 2018-05-07 | Discharge: 2018-05-07 | Disposition: A | Discharge status: Home / Self Care | Payer: Medicare Other | Source: Ambulatory Visit | Attending: Neurology | Admitting: Neurology

## 2018-05-07 DIAGNOSIS — R413 Other amnesia: Secondary | ICD-10-CM | POA: Diagnosis not present

## 2018-06-28 ENCOUNTER — Other Ambulatory Visit: Payer: Self-pay | Admitting: Internal Medicine

## 2018-06-28 DIAGNOSIS — Z1231 Encounter for screening mammogram for malignant neoplasm of breast: Secondary | ICD-10-CM

## 2019-01-19 IMAGING — CT CT CHEST W/ CM
2 of 4 series · 15 of 36 positions shown, 18 images · IV contrast (iopamidol)
Comparison: Neck CT, 12/28/2017.

CLINICAL DATA: F/U CT Neck with contrast done 01/17/2018. Pt states
she is currently asymptomatic. No hx Lung disease. NKI No hx CA. No
hx thoracic surg. Current smoker x 55 years, 1 pack a day.

EXAM:
CT CHEST WITH CONTRAST
TECHNIQUE: Multidetector CT imaging of the chest was performed during
intravenous contrast administration.
CONTRAST:  60mL PYKBUB-PPP IOPAMIDOL (PYKBUB-PPP) INJECTION 61%

[Series 2: axial chest · axial · 0.57mm/px · z∈[-1289,-1007]mm · 12 of 167 slices shown, 15 images]
[im 13/167  mediastinal]
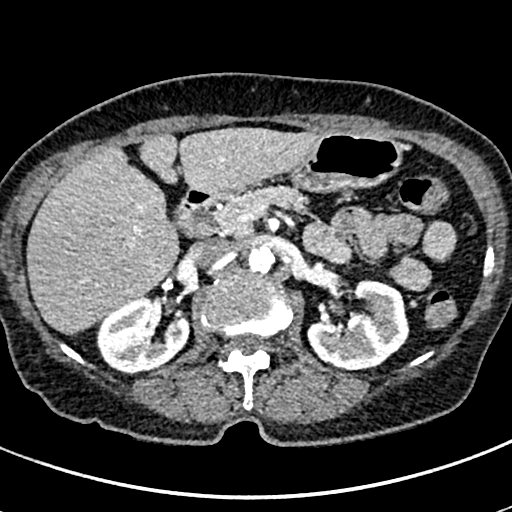
[im 13/167  lung]
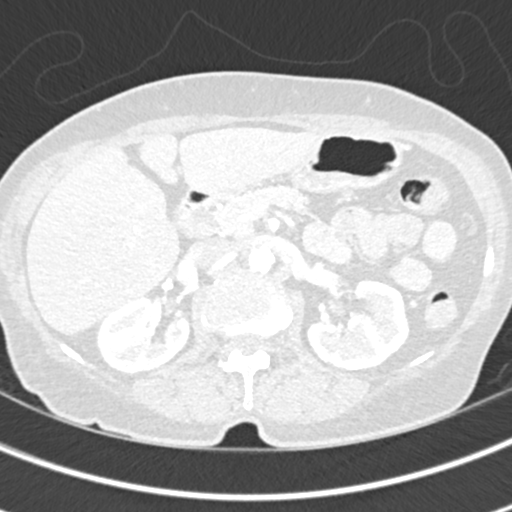
[im 26/167  lung]
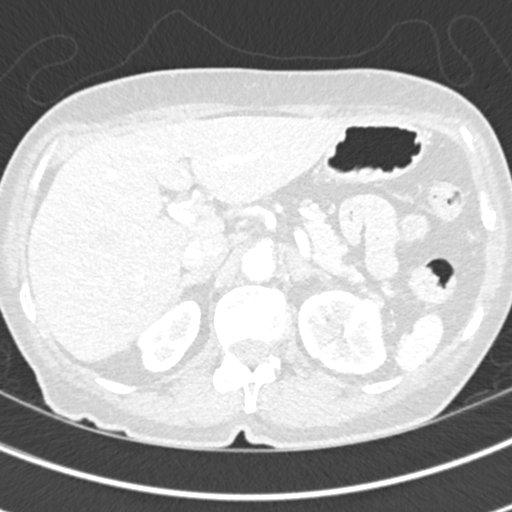
[im 39/167  lung]
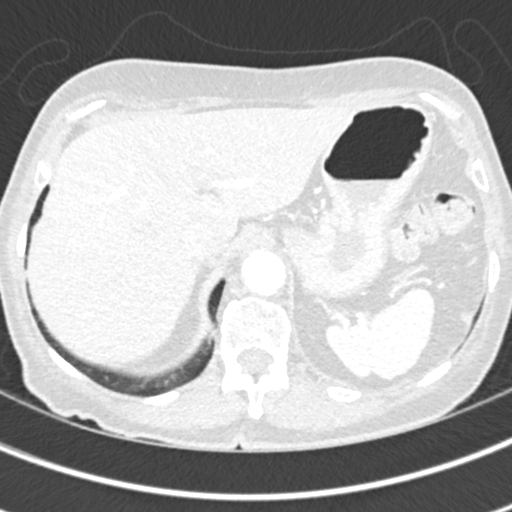
[im 52/167  lung]
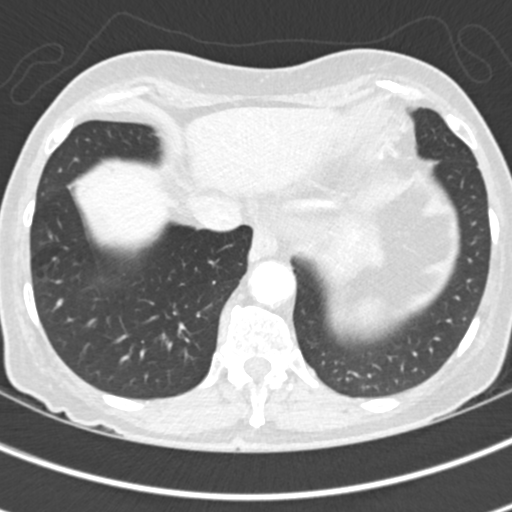
[im 64/167  mediastinal]
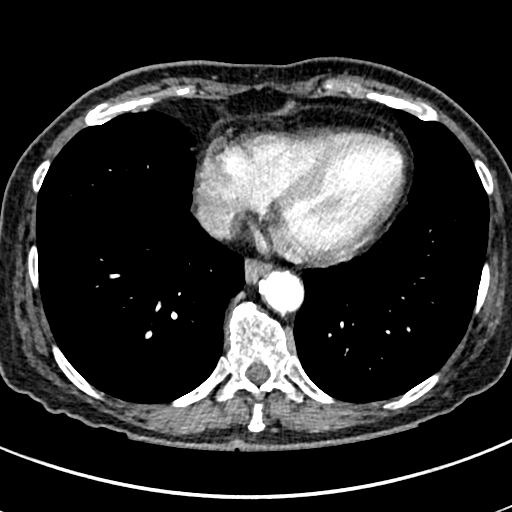
[im 64/167  lung]
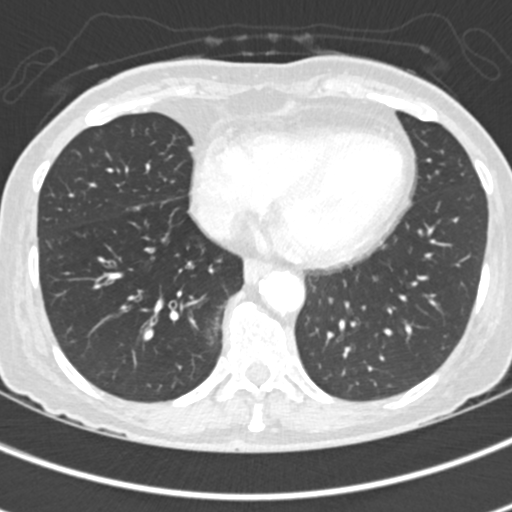
[im 77/167  lung]
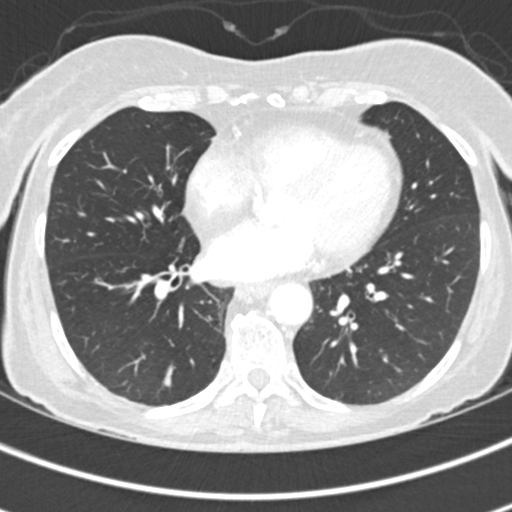
[im 90/167  lung]
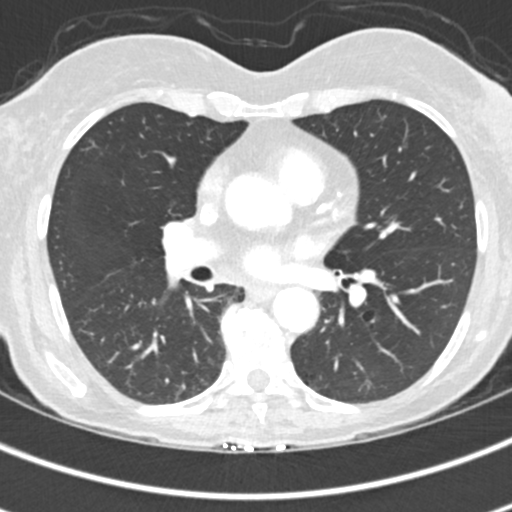
[im 103/167  lung]
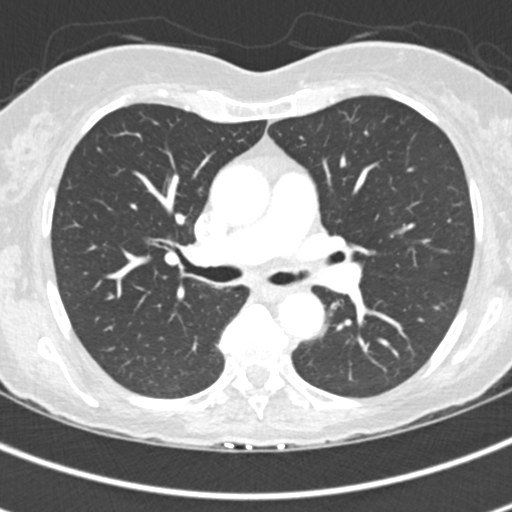
[im 115/167  mediastinal]
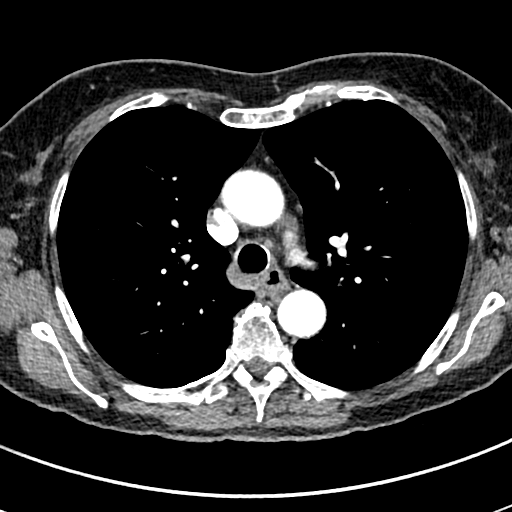
[im 115/167  lung]
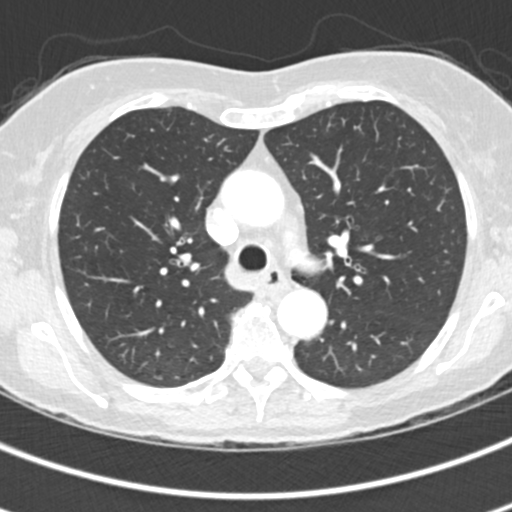
[im 128/167  lung]
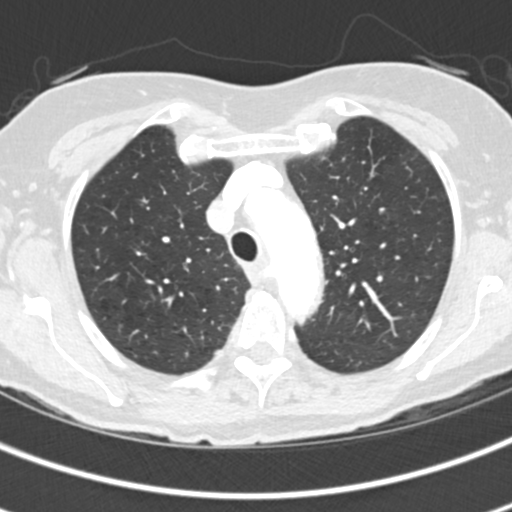
[im 141/167  lung]
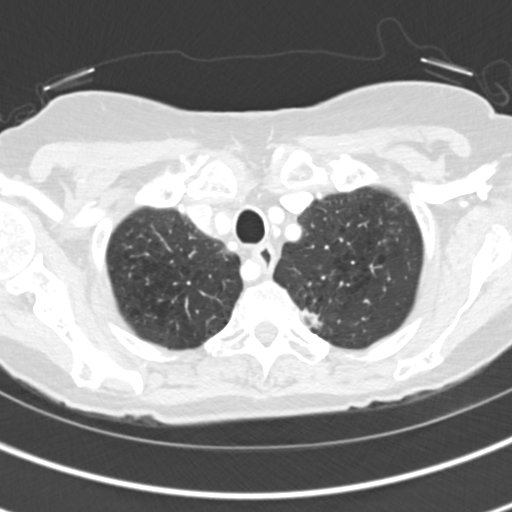
[im 154/167  lung]
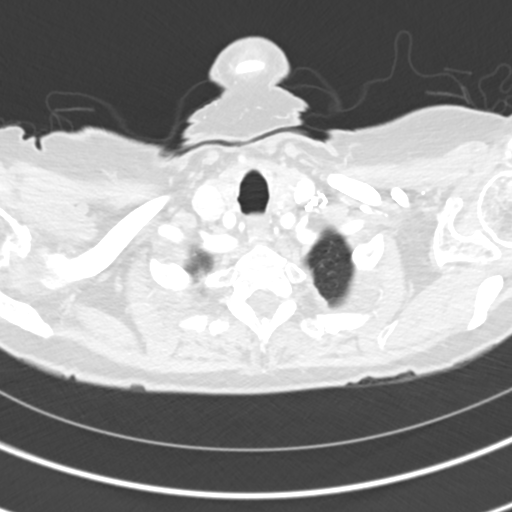

[Series 4: coronal chest · coronal · 0.57mm/px · 3 of 124 slices shown]
[im 25/124  lung]
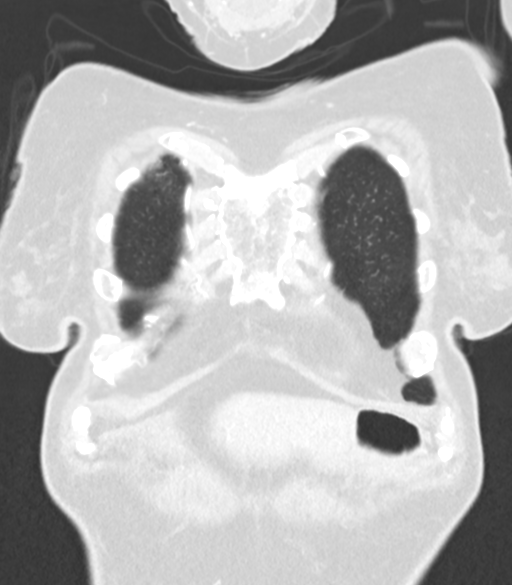
[im 50/124  lung]
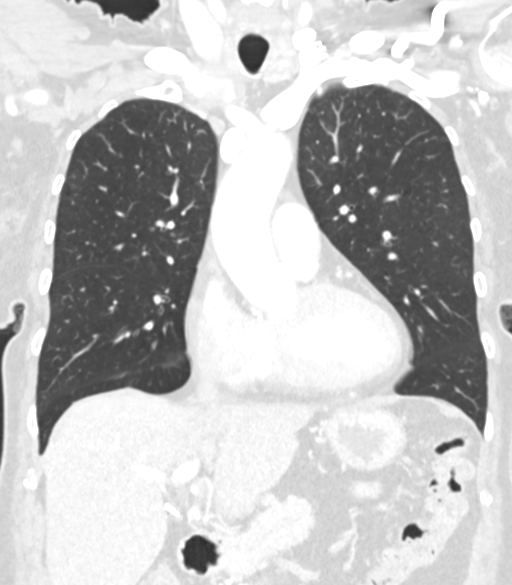
[im 74/124  lung]
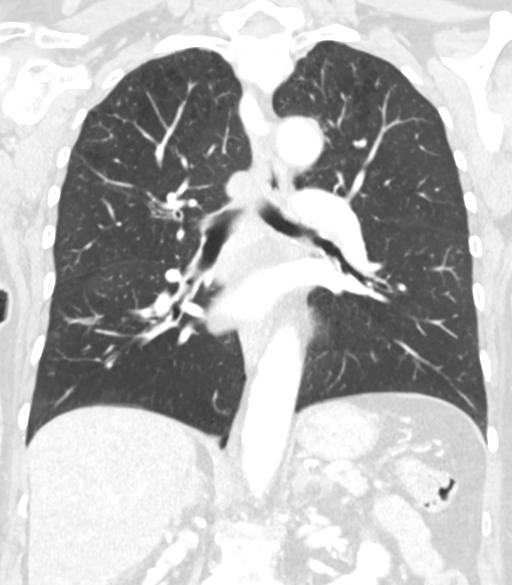

[15 of 36 positions shown; findings below may reference images not displayed]

FINDINGS: Cardiovascular: Heart top-normal in size. Normal in configuration.
No pericardial effusion. Three-vessel coronary artery
calcifications. Great vessels are normal in caliber. There is aortic
atherosclerosis. No dissection. An aberrant right subclavian arises
from the distal aortic arch passing posterior to the upper thoracic
esophagus. Atherosclerotic changes are noted in the aortic arch
branch vessels without significant stenosis.

Mediastinum/Nodes: Heterogeneous appearing thyroid suggesting
multiple small nodules, stable from the prior neck CT. No neck base
or axillary masses or enlarged lymph nodes. No mediastinal or hilar
masses or enlarged lymph nodes. Trachea and esophagus are
unremarkable.

Lungs/Pleura: Asymmetric pleural based opacity along the
posteromedial aspect of the left upper lobe measuring 17 x 9 mm,
without change from prior neck CT.

Ill-defined, partly ground-glass nodule measuring 5 mm, left lower
lobe, image 82, series 3.

Mild bronchial wall thickening which is most evident in the right
lower lobe.

Mild centrilobular emphysema in the upper lobes.

No evidence of pneumonia or pulmonary edema. No pleural effusion or
pneumothorax.

Upper Abdomen: No acute abnormality.

Musculoskeletal: No fracture or acute finding. No osteoblastic or
osteolytic lesions.
IMPRESSION: 1. The pleural based opacity noted in the posteromedial left upper
lobe on the prior exam is unchanged in size, most likely due to
scarring. Recommend additional follow-up imaging with unenhanced
chest CT in 12-18 months to document longer term stability.
2. 5 mm ill-defined nodule in the left lower lobe. No follow-up
recommended. This recommendation follows the consensus statement:
Guidelines for Management of Incidental Pulmonary Nodules Detected
[DATE].
3. Areas of bronchial wall thickening most evident in the right
lower lobe, presumed chronic. No evidence of pneumonia or pulmonary
edema.
4. Aortic atherosclerosis with aberrant right subclavian artery.
5. Centrilobular emphysema.

Aortic Atherosclerosis (9ELN2-JQG.G) and Emphysema (9ELN2-M78.5).

## 2020-07-01 ENCOUNTER — Ambulatory Visit: Payer: Medicare Other | Admitting: Dermatology

## 2024-05-23 ENCOUNTER — Inpatient Hospital Stay
Admission: EM | Admit: 2024-05-23 | Source: Home / Self Care | Attending: Internal Medicine | Admitting: Internal Medicine

## 2024-05-23 ENCOUNTER — Inpatient Hospital Stay: Admitting: Anesthesiology

## 2024-05-23 ENCOUNTER — Other Ambulatory Visit: Payer: Self-pay

## 2024-05-23 ENCOUNTER — Inpatient Hospital Stay

## 2024-05-23 ENCOUNTER — Emergency Department

## 2024-05-23 ENCOUNTER — Encounter: Admission: EM | Payer: Self-pay | Source: Home / Self Care | Attending: Internal Medicine

## 2024-05-23 ENCOUNTER — Encounter: Payer: Self-pay | Admitting: Emergency Medicine

## 2024-05-23 DIAGNOSIS — E890 Postprocedural hypothyroidism: Secondary | ICD-10-CM | POA: Diagnosis present

## 2024-05-23 DIAGNOSIS — F0390 Unspecified dementia without behavioral disturbance: Secondary | ICD-10-CM | POA: Insufficient documentation

## 2024-05-23 DIAGNOSIS — I10 Essential (primary) hypertension: Secondary | ICD-10-CM | POA: Insufficient documentation

## 2024-05-23 DIAGNOSIS — W19XXXA Unspecified fall, initial encounter: Secondary | ICD-10-CM

## 2024-05-23 DIAGNOSIS — S72002A Fracture of unspecified part of neck of left femur, initial encounter for closed fracture: Secondary | ICD-10-CM

## 2024-05-23 DIAGNOSIS — C089 Malignant neoplasm of major salivary gland, unspecified: Secondary | ICD-10-CM | POA: Diagnosis present

## 2024-05-23 DIAGNOSIS — S72009A Fracture of unspecified part of neck of unspecified femur, initial encounter for closed fracture: Secondary | ICD-10-CM | POA: Diagnosis present

## 2024-05-23 DIAGNOSIS — D72829 Elevated white blood cell count, unspecified: Secondary | ICD-10-CM

## 2024-05-23 DIAGNOSIS — S72145A Nondisplaced intertrochanteric fracture of left femur, initial encounter for closed fracture: Principal | ICD-10-CM

## 2024-05-23 DIAGNOSIS — S72142A Displaced intertrochanteric fracture of left femur, initial encounter for closed fracture: Secondary | ICD-10-CM

## 2024-05-23 LAB — TYPE AND SCREEN
ABO/RH(D): O POS
Antibody Screen: NEGATIVE

## 2024-05-23 LAB — COMPREHENSIVE METABOLIC PANEL WITH GFR
ALT: 8 U/L (ref 0–44)
AST: 16 U/L (ref 15–41)
Albumin: 3.4 g/dL — ABNORMAL LOW (ref 3.5–5.0)
Alkaline Phosphatase: 84 U/L (ref 38–126)
Anion gap: 9 (ref 5–15)
BUN: 6 mg/dL — ABNORMAL LOW (ref 8–23)
CO2: 30 mmol/L (ref 22–32)
Calcium: 9.2 mg/dL (ref 8.9–10.3)
Chloride: 99 mmol/L (ref 98–111)
Creatinine, Ser: 0.64 mg/dL (ref 0.44–1.00)
GFR, Estimated: >60 mL/min
Glucose, Bld: 119 mg/dL — ABNORMAL HIGH (ref 70–99)
Potassium: 3.4 mmol/L — ABNORMAL LOW (ref 3.5–5.1)
Sodium: 138 mmol/L (ref 135–145)
Total Bilirubin: 0.4 mg/dL (ref 0.0–1.2)
Total Protein: 6.4 g/dL — ABNORMAL LOW (ref 6.5–8.1)

## 2024-05-23 LAB — CBC WITH DIFFERENTIAL/PLATELET
Abs Immature Granulocytes: 0.17 10*3/uL — ABNORMAL HIGH (ref 0.00–0.07)
Basophils Absolute: 0.1 10*3/uL (ref 0.0–0.1)
Basophils Relative: 0 %
Eosinophils Absolute: 0 10*3/uL (ref 0.0–0.5)
Eosinophils Relative: 0 %
HCT: 43.1 % (ref 36.0–46.0)
Hemoglobin: 14.4 g/dL (ref 12.0–15.0)
Immature Granulocytes: 1 %
Lymphocytes Relative: 8 %
Lymphs Abs: 1.4 10*3/uL (ref 0.7–4.0)
MCH: 31.3 pg (ref 26.0–34.0)
MCHC: 33.4 g/dL (ref 30.0–36.0)
MCV: 93.7 fL (ref 80.0–100.0)
Monocytes Absolute: 0.8 10*3/uL (ref 0.1–1.0)
Monocytes Relative: 5 %
Neutro Abs: 15.7 10*3/uL — ABNORMAL HIGH (ref 1.7–7.7)
Neutrophils Relative %: 86 %
Platelets: 249 10*3/uL (ref 150–400)
RBC: 4.6 MIL/uL (ref 3.87–5.11)
RDW: 13.8 % (ref 11.5–15.5)
WBC: 18.3 10*3/uL — ABNORMAL HIGH (ref 4.0–10.5)
nRBC: 0 % (ref 0.0–0.2)

## 2024-05-23 MED ORDER — ONDANSETRON HCL 4 MG PO TABS: 4.0000 mg | ORAL_TABLET | Freq: Four times a day (QID) | ORAL | Status: AC | PRN

## 2024-05-23 MED ORDER — METOCLOPRAMIDE HCL 5 MG/ML IJ SOLN: 5.0000 mg | Freq: Three times a day (TID) | INTRAMUSCULAR | Status: AC | PRN

## 2024-05-23 MED ORDER — 0.9 % SODIUM CHLORIDE (POUR BTL) OPTIME
TOPICAL | Status: DC | PRN
  Administered 2024-05-23: 500 mL

## 2024-05-23 MED ORDER — LACTATED RINGERS IV SOLN: INTRAVENOUS | Status: DC | PRN

## 2024-05-23 MED ORDER — METOCLOPRAMIDE HCL 5 MG PO TABS: 5.0000 mg | ORAL_TABLET | Freq: Three times a day (TID) | ORAL | Status: AC | PRN

## 2024-05-23 MED ORDER — FENTANYL CITRATE (PF) 100 MCG/2ML IJ SOLN
INTRAMUSCULAR | Status: AC
  Filled 2024-05-23: qty 2

## 2024-05-23 MED ORDER — POTASSIUM CHLORIDE CRYS ER 20 MEQ PO TBCR
20.0000 meq | EXTENDED_RELEASE_TABLET | Freq: Once | ORAL | Status: AC
  Administered 2024-05-23: 20 meq via ORAL
  Filled 2024-05-23: qty 1

## 2024-05-23 MED ORDER — LOSARTAN POTASSIUM 50 MG PO TABS
50.0000 mg | ORAL_TABLET | Freq: Every day | ORAL | Status: AC
  Administered 2024-05-24 – 2024-05-26 (×3): 50 mg via ORAL
  Filled 2024-05-23 (×3): qty 1

## 2024-05-23 MED ORDER — HYDROCODONE-ACETAMINOPHEN 5-325 MG PO TABS
1.0000 | ORAL_TABLET | Freq: Four times a day (QID) | ORAL | Status: AC | PRN
  Administered 2024-05-23 – 2024-05-25 (×3): 1 via ORAL
  Administered 2024-05-25: 2 via ORAL
  Filled 2024-05-23 (×3): qty 1
  Filled 2024-05-23: qty 2
  Filled 2024-05-23: qty 1

## 2024-05-23 MED ORDER — TRANEXAMIC ACID-NACL 1000-0.7 MG/100ML-% IV SOLN
INTRAVENOUS | Status: AC
  Filled 2024-05-23: qty 100

## 2024-05-23 MED ORDER — MORPHINE SULFATE (PF) 2 MG/ML IV SOLN: 0.5000 mg | INTRAVENOUS | Status: DC | PRN

## 2024-05-23 MED ORDER — CEFAZOLIN SODIUM-DEXTROSE 2-3 GM-%(50ML) IV SOLR
INTRAVENOUS | Status: DC | PRN
  Administered 2024-05-23: 2 g via INTRAVENOUS

## 2024-05-23 MED ORDER — FENTANYL CITRATE (PF) 50 MCG/ML IJ SOSY
50.0000 ug | PREFILLED_SYRINGE | Freq: Once | INTRAMUSCULAR | Status: AC
  Administered 2024-05-23: 50 ug via INTRAVENOUS

## 2024-05-23 MED ORDER — POTASSIUM CHLORIDE IN NACL 20-0.9 MEQ/L-% IV SOLN
INTRAVENOUS | Status: AC
  Filled 2024-05-23: qty 1000

## 2024-05-23 MED ORDER — MENTHOL 3 MG MT LOZG: 1.0000 | LOZENGE | OROMUCOSAL | Status: AC | PRN

## 2024-05-23 MED ORDER — TRANEXAMIC ACID-NACL 1000-0.7 MG/100ML-% IV SOLN
1000.0000 mg | Freq: Once | INTRAVENOUS | Status: AC
  Administered 2024-05-23: 1000 mg via INTRAVENOUS
  Filled 2024-05-23: qty 100

## 2024-05-23 MED ORDER — FENTANYL CITRATE (PF) 100 MCG/2ML IJ SOLN
INTRAMUSCULAR | Status: DC | PRN
  Administered 2024-05-23 (×2): 50 ug via INTRAVENOUS

## 2024-05-23 MED ORDER — BUPIVACAINE HCL (PF) 0.25 % IJ SOLN
INTRAMUSCULAR | Status: DC | PRN
  Administered 2024-05-23: 10 mL

## 2024-05-23 MED ORDER — PHENOL 1.4 % MT LIQD: 1.0000 | OROMUCOSAL | Status: AC | PRN

## 2024-05-23 MED ORDER — ONDANSETRON HCL 4 MG/2ML IJ SOLN
INTRAMUSCULAR | Status: DC | PRN
  Administered 2024-05-23: 4 mg via INTRAVENOUS

## 2024-05-23 MED ORDER — ASPIRIN 81 MG PO TBEC
81.0000 mg | DELAYED_RELEASE_TABLET | Freq: Two times a day (BID) | ORAL | Status: AC
  Administered 2024-05-23 – 2024-05-26 (×7): 81 mg via ORAL
  Filled 2024-05-23 (×7): qty 1

## 2024-05-23 MED ORDER — MORPHINE SULFATE (PF) 2 MG/ML IV SOLN
2.0000 mg | INTRAVENOUS | Status: AC | PRN
  Administered 2024-05-23: 2 mg via INTRAVENOUS
  Filled 2024-05-23: qty 1

## 2024-05-23 MED ORDER — CEFAZOLIN SODIUM-DEXTROSE 2-4 GM/100ML-% IV SOLN
INTRAVENOUS | Status: AC
  Filled 2024-05-23: qty 100

## 2024-05-23 MED ORDER — LIDOCAINE HCL (CARDIAC) PF 100 MG/5ML IV SOSY
PREFILLED_SYRINGE | INTRAVENOUS | Status: DC | PRN
  Administered 2024-05-23: 50 mg via INTRAVENOUS

## 2024-05-23 MED ORDER — ALBUTEROL SULFATE HFA 108 (90 BASE) MCG/ACT IN AERS
INHALATION_SPRAY | RESPIRATORY_TRACT | Status: DC | PRN
  Administered 2024-05-23: 3 via RESPIRATORY_TRACT

## 2024-05-23 MED ORDER — BUPIVACAINE HCL (PF) 0.25 % IJ SOLN
INTRAMUSCULAR | Status: AC
  Filled 2024-05-23: qty 30

## 2024-05-23 MED ORDER — ACETAMINOPHEN 10 MG/ML IV SOLN
INTRAVENOUS | Status: AC
  Filled 2024-05-23: qty 100

## 2024-05-23 MED ORDER — FENTANYL CITRATE (PF) 50 MCG/ML IJ SOSY
PREFILLED_SYRINGE | INTRAMUSCULAR | Status: AC
  Filled 2024-05-23: qty 1

## 2024-05-23 MED ORDER — FENTANYL CITRATE (PF) 100 MCG/2ML IJ SOLN
25.0000 ug | INTRAMUSCULAR | Status: DC | PRN
  Administered 2024-05-23: 25 ug via INTRAVENOUS

## 2024-05-23 MED ORDER — OXYCODONE HCL 5 MG/5ML PO SOLN: 5.0000 mg | Freq: Once | ORAL | Status: DC | PRN

## 2024-05-23 MED ORDER — PHENYLEPHRINE 80 MCG/ML (10ML) SYRINGE FOR IV PUSH (FOR BLOOD PRESSURE SUPPORT)
PREFILLED_SYRINGE | INTRAVENOUS | Status: DC | PRN
  Administered 2024-05-23 (×2): 80 ug via INTRAVENOUS

## 2024-05-23 MED ORDER — ACETAMINOPHEN 10 MG/ML IV SOLN
INTRAVENOUS | Status: DC | PRN
  Administered 2024-05-23: 1000 mg via INTRAVENOUS

## 2024-05-23 MED ORDER — METHOCARBAMOL 500 MG PO TABS
500.0000 mg | ORAL_TABLET | Freq: Four times a day (QID) | ORAL | Status: AC | PRN
  Administered 2024-05-25: 500 mg via ORAL
  Filled 2024-05-23: qty 1

## 2024-05-23 MED ORDER — DEXAMETHASONE SOD PHOSPHATE PF 10 MG/ML IJ SOLN
INTRAMUSCULAR | Status: DC | PRN
  Administered 2024-05-23: 8 mg via INTRAVENOUS

## 2024-05-23 MED ORDER — PHENYLEPHRINE HCL-NACL 20-0.9 MG/250ML-% IV SOLN
INTRAVENOUS | Status: AC
  Filled 2024-05-23: qty 250

## 2024-05-23 MED ORDER — SUGAMMADEX SODIUM 200 MG/2ML IV SOLN
INTRAVENOUS | Status: DC | PRN
  Administered 2024-05-23: 200 mg via INTRAVENOUS

## 2024-05-23 MED ORDER — ROCURONIUM BROMIDE 100 MG/10ML IV SOLN
INTRAVENOUS | Status: DC | PRN
  Administered 2024-05-23: 50 mg via INTRAVENOUS

## 2024-05-23 MED ORDER — LEVOTHYROXINE SODIUM 50 MCG PO TABS
75.0000 ug | ORAL_TABLET | Freq: Every day | ORAL | Status: AC
  Administered 2024-05-23 – 2024-05-26 (×4): 75 ug via ORAL
  Filled 2024-05-23: qty 1
  Filled 2024-05-23: qty 2
  Filled 2024-05-23 (×2): qty 1

## 2024-05-23 MED ORDER — OXYCODONE HCL 5 MG PO TABS: 5.0000 mg | ORAL_TABLET | Freq: Once | ORAL | Status: DC | PRN

## 2024-05-23 MED ORDER — CEFAZOLIN SODIUM-DEXTROSE 2-4 GM/100ML-% IV SOLN
2.0000 g | Freq: Three times a day (TID) | INTRAVENOUS | Status: AC
  Administered 2024-05-24 (×2): 2 g via INTRAVENOUS
  Filled 2024-05-23 (×2): qty 100

## 2024-05-23 MED ORDER — PROPOFOL 10 MG/ML IV BOLUS
INTRAVENOUS | Status: DC | PRN
  Administered 2024-05-23: 100 mg via INTRAVENOUS

## 2024-05-23 MED ORDER — METHOCARBAMOL 1000 MG/10ML IJ SOLN: 500.0000 mg | Freq: Four times a day (QID) | INTRAMUSCULAR | Status: AC | PRN

## 2024-05-23 MED ORDER — PHENYLEPHRINE HCL-NACL 20-0.9 MG/250ML-% IV SOLN
INTRAVENOUS | Status: DC | PRN
  Administered 2024-05-23: 25 ug/min via INTRAVENOUS

## 2024-05-23 MED ORDER — ONDANSETRON HCL 4 MG/2ML IJ SOLN: 4.0000 mg | Freq: Four times a day (QID) | INTRAMUSCULAR | Status: AC | PRN

## 2024-05-23 MED ORDER — SENNA 8.6 MG PO TABS
1.0000 | ORAL_TABLET | Freq: Every day | ORAL | Status: DC
  Administered 2024-05-23 – 2024-05-25 (×3): 8.6 mg via ORAL
  Filled 2024-05-23 (×3): qty 1

## 2024-05-23 NOTE — Assessment & Plan Note (Signed)
 Per daughter, patient has dementia-no prior documentation on record Delirium precautions

## 2024-05-23 NOTE — Assessment & Plan Note (Signed)
 Accidental fall Pain control N.p.o. for surgery later this morning Further orders per Ortho-Dr. Mariah Preoperative clearance:Patient is considered to be at low risk of perioperative cardiopulmonary events.  Chronic medical conditions are stable.  No acute cardiovascular contraindications identified.  Patient is hemodynamically stable with no active cardiac ischemia, significant electrolyte disturbance, severe anemia, or severe acute renal dysfunction.  Can proceed with surgery. Postop VTE prophylaxis per orthopedic preference.

## 2024-05-23 NOTE — Progress Notes (Signed)
 Interval events noted.  She complains of severe pain in the left hip.  She has a chronic cough and postnasal drip and attributes this to tobacco use disorder.  No fever, shortness of breath or chest pain.  Vital signs are stable.  Darlene Grimes is a 79 y.o. female with medical history significant for Low-grade carcinoma of the saliva gland(2019) s/p resection, hypothyroidism, hypertension, who presented to the hospital because of severe pain in the left hip after an accidental fall at home.  She was found to have left intertrochanteric fracture.  Continue analgesics as needed for pain.  Plan for left hip surgery today.  Follow-up with orthopedic surgeon.  Plan of care was discussed with Brona, daughter, at the bedside.

## 2024-05-23 NOTE — ED Triage Notes (Signed)
 Patient presents via EMS from home s/p fall. Denies head injury or loc.  Patient c/o left hip pain. Denies any other injuries. Patient refusing lab testing. States lets just check the hip.

## 2024-05-23 NOTE — Assessment & Plan Note (Signed)
"   Continue losartan          "

## 2024-05-23 NOTE — ED Provider Notes (Signed)
 "  [facility] Provider Note   Event Date/Time   First MD Initiated Contact with Patient 05/23/24 [phone]     (approximate) History  Fall  HPI Darlene Grimes is a 79 y.o. female with a past medical history of hypertension and hypothyroidism who presents after mechanical fall from standing via EMS complaining of of left hip pain.  EMS also noted patient to be tachypneic with tachycardia in the setting of tobacco abuse.  They also noted rhonchi to bilateral lower lung fields and hypoxia to 90% on their arrival that improved to 96% on 1 L.  Patient states that this was a trip and fall onto this left side without any head injury or loss of consciousness.  When asked further questions about the tachypnea and rhonchi, she states she would only like to be checked out for her left hip.  Patient has intact sensation and muscular strength in this left lower extremity.  EMS stated the patient was able to bear a slight amount of weight on his left leg to turn and pivot onto their stretcher. ROS: Patient currently denies any vision changes, tinnitus, difficulty speaking, facial droop, sore throat, chest pain, shortness of breath, abdominal pain, nausea/vomiting/diarrhea, dysuria, or weakness/numbness/paresthesias in any extremity   Physical Exam  Triage Vital Signs: ED Triage Vitals  Encounter Vitals Group     BP      Girls Systolic BP Percentile      Girls Diastolic BP Percentile      Boys Systolic BP Percentile      Boys Diastolic BP Percentile      Pulse      Resp      Temp      Temp src      SpO2      Weight      Height      Head Circumference      Peak Flow      Pain Score      Pain Loc      Pain Education      Exclude from Growth Chart    Most recent vital signs: Vitals:   05/23/24 0400 05/23/24 0430  BP: 130/77 (!) 140/62  Pulse: 93 88  Resp: 13 18  Temp:    SpO2: 93% 93%   General: Awake, oriented x4. CV:  Good peripheral perfusion. Resp:  Normal  effort. Abd:  No distention. Other:  Elderly the well-developed, well-nourished Caucasian female resting comfortably in no acute distress.  Tenderness to palpation over the left lateral hip with limited range of motion secondary to pain.  Patient holding leg in slight flexion as a position of comfort ED Results / Procedures / Treatments  Labs (all labs ordered are listed, but only abnormal results are displayed) Labs Reviewed  COMPREHENSIVE METABOLIC PANEL WITH GFR - Abnormal; Notable for the following components:      Result Value   Potassium 3.4 (*)    Glucose, Bld 119 (*)    BUN 6 (*)    Total Protein 6.4 (*)    Albumin 3.4 (*)    All other components within normal limits  CBC WITH DIFFERENTIAL/PLATELET - Abnormal; Notable for the following components:   WBC 18.3 (*)    Neutro Abs 15.7 (*)    Abs Immature Granulocytes 0.17 (*)    All other components within normal limits   RADIOLOGY ED MD interpretation: X-ray of the left hip shows acute nondisplaced left femoral intertrochanteric fracture with diffuse osteopenia -  All radiology independently interpreted and agree with radiology assessment Official radiology report(s): DG Hip Unilat W or Wo Pelvis 2-3 Views Left Result Date: 05/23/2024 EXAM: 2 or 3 VIEW(S) XRAY OF THE PELVIS AND LEFT HIP 05/23/2024 03:33:00 AM COMPARISON: None available. CLINICAL HISTORY: Fall with left hip pain. FINDINGS: BONES AND JOINTS: SI joints are symmetric. Nondisplaced left femoral intertrochanteric fracture. Bilateral hips demonstrate normal alignment. The bones are diffusely osteopenic. LUMBAR SPINE: Lumbosacral degenerative change. SOFT TISSUES: Iliofemoral arterial calcifications. IMPRESSION: 1. Nondisplaced left femoral intertrochanteric fracture. 2. Diffuse osteopenia. 3. Iliofemoral arterial calcifications. Electronically signed by: Greig Pique MD 05/23/2024 03:39 AM EST RP Workstation: [id number]   PROCEDURES: Critical Care performed: Yes, see  critical care procedure note(s) Procedures CRITICAL CARE Performed by: Melinna Linarez K Lamarkus Nebel  Total critical care time: 31 minutes  Critical care time was exclusive of separately billable procedures and treating other patients.  Critical care was necessary to treat or prevent imminent or life-threatening deterioration.  Critical care was time spent personally by me on the following activities: development of treatment plan with patient and/or surrogate as well as nursing, discussions with consultants, evaluation of patient's response to treatment, examination of patient, obtaining history from patient or surrogate, ordering and performing treatments and interventions, ordering and review of laboratory studies, ordering and review of radiographic studies, pulse oximetry and re-evaluation of patient's condition.  MEDICATIONS ORDERED IN ED: Medications - No data to display IMPRESSION / MDM / ASSESSMENT AND PLAN / ED COURSE  I reviewed the triage vital signs and the nursing notes.                             The patient is on the cardiac monitor to evaluate for evidence of arrhythmia and/or significant heart rate changes. Patient's presentation is most consistent with acute presentation with potential threat to life or bodily function. Patient is a 79 year old female with the above-stated past medical history presents via EMS after mechanical fall from standing complaining of left hip pain DDx: Femur fracture, pelvic fracture, musculoskeletal injury, hematoma Plan: X-ray of the pelvis and left hip  Patient has acute intertrochanteric fracture of the left femur.  I have spoken to Dr. Mariah and orthopedics who agrees to take this patient to the OR today.  I have also spoken to Dr. Cleatus on the hospitalist service who agrees to accept this patient for further management.  Patient agrees with plan for admission and surgical intervention  Dispo: Admit to medicine   FINAL CLINICAL IMPRESSION(S) / ED  DIAGNOSES   Final diagnoses:  Nondisplaced intertrochanteric fracture of left femur, initial encounter for closed fracture (HCC)  Fall, initial encounter   Rx / DC Orders   ED Discharge Orders     None      Note:  This document was prepared using Dragon voice recognition software and may include unintentional dictation errors.   Romel Dumond K, MD 05/23/24 [phone]  "

## 2024-05-23 NOTE — ED Notes (Signed)
 Pt transported to surgery by OR staff.

## 2024-05-23 NOTE — Transfer of Care (Signed)
 Immediate Anesthesia Transfer of Care Note  Patient: Darlene Grimes  Procedure(s) Performed: FIXATION, FRACTURE, INTERTROCHANTERIC, WITH INTRAMEDULLARY ROD (Left: Hip)  Patient Location: PACU  Anesthesia Type:General  Level of Consciousness: drowsy  Airway & Oxygen Therapy: Patient Spontanous Breathing and Patient connected to face mask oxygen  Post-op Assessment: Report given to RN and Post -op Vital signs reviewed and stable  Post vital signs: Reviewed and stable  Last Vitals:  Vitals Value Taken Time  BP 168/73 05/23/24 18:05  Temp 98.71f   Pulse 97 05/23/24 18:09  Resp 19 05/23/24 18:09  SpO2 100 % 05/23/24 18:09  Vitals shown include unfiled device data.  Last Pain:  Vitals:   05/23/24 1344  TempSrc:   PainSc: 10-Worst pain ever         Complications: No notable events documented.

## 2024-05-23 NOTE — Assessment & Plan Note (Signed)
 Continue levothyroxine 

## 2024-05-23 NOTE — Plan of Care (Signed)
   Problem: Clinical Measurements: Goal: Ability to maintain clinical measurements within normal limits will improve Outcome: Progressing

## 2024-05-23 NOTE — Anesthesia Preprocedure Evaluation (Signed)
"                                    Anesthesia Evaluation  Patient identified by MRN, date of birth, ID band Patient awake    Reviewed: Allergy & Precautions, NPO status , Patient's Chart, lab work & pertinent test results  History of Anesthesia Complications Negative for: history of anesthetic complications  Airway Mallampati: III  TM Distance: >3 FB Neck ROM: full    Dental  (+) Missing, Edentulous Upper, Poor Dentition   Pulmonary neg pulmonary ROS, Current Smoker and Patient abstained from smoking.   Pulmonary exam normal        Cardiovascular hypertension, On Medications negative cardio ROS Normal cardiovascular exam     Neuro/Psych negative neurological ROS     GI/Hepatic negative GI ROS, Neg liver ROS,,,  Endo/Other  Hypothyroidism    Renal/GU      Musculoskeletal   Abdominal   Peds  Hematology negative hematology ROS (+)   Anesthesia Other Findings Past Medical History: No date: Hypertension No date: Hypothyroidism  Past Surgical History: No date: CHOLECYSTECTOMY 02/01/2018: SUBMANDIBULAR GLAND EXCISION; Left     Comment:  Procedure: EXCISION SUBMANDIBULAR GLAND;  Surgeon:               Herminio Miu, MD;  Location: [facility] ORS;  Service: ENT;              Laterality: Left;  BMI    Body Mass Index: 22.67 kg/m      Reproductive/Obstetrics negative OB ROS                              Anesthesia Physical Anesthesia Plan  ASA: 2  Anesthesia Plan: General ETT   Post-op Pain Management: Toradol IV (intra-op)* and Ofirmev  IV (intra-op)*   Induction: Intravenous  PONV Risk Score and Plan: 3 and Ondansetron , Dexamethasone  and Treatment may vary due to age or medical condition  Airway Management Planned: Oral ETT  Additional Equipment:   Intra-op Plan:   Post-operative Plan: Extubation in OR  Informed Consent: I have reviewed the patients History and Physical, chart, labs and discussed the  procedure including the risks, benefits and alternatives for the proposed anesthesia with the patient or authorized representative who has indicated his/her understanding and acceptance.     Dental Advisory Given  Plan Discussed with: Anesthesiologist, CRNA and Surgeon  Anesthesia Plan Comments: (Patient consented for risks of anesthesia including but not limited to:  - adverse reactions to medications - damage to eyes, teeth, lips or other oral mucosa - nerve damage due to positioning  - sore throat or hoarseness - Damage to heart, brain, nerves, lungs, other parts of body or loss of life  Patient voiced understanding and assent.)         Anesthesia Quick Evaluation  "

## 2024-05-23 NOTE — Consult Note (Signed)
 [facility]  CONSULTATION NOTE  Patient Name: Darlene Grimes DOB: Aug 09, 1945 CSN: [id number] Primary Physician: Corlis Honor BROCKS, MD (Inactive)   Reason for Consult: Left hip fracture  HPI: Darlene Grimes is a 79 y.o. female who presented to the hospital after a slip and fall early this AM. Patient reports that she slipped and fell landing directly on the left hip. Was unable to ambulate after fall. Was brought into ED and found to have a left hip fracture. Patient now with severe pain in the left hip, otherwise no complaints.     Past Medical History:  Diagnosis Date   Hypertension    Hypothyroidism       Past Surgical History:  Procedure Laterality Date   CHOLECYSTECTOMY     SUBMANDIBULAR GLAND EXCISION Left 02/01/2018   Procedure: EXCISION SUBMANDIBULAR GLAND;  Surgeon: Herminio Miu, MD;  Location: [facility];  Service: ENT;  Laterality: Left;     Medications Ordered Prior to Encounter[1]   Family History  Problem Relation Age of Onset   Breast cancer Mother 97   Breast cancer Maternal Aunt        54's      Social History   Socioeconomic History   Marital status: Married    Spouse name: Not on file   Number of children: Not on file   Years of education: Not on file   Highest education level: Not on file  Occupational History   Not on file  Tobacco Use   Smoking status: Every Day    Current packs/day: 0.50    Average packs/day: 0.5 packs/day for 56.0 years (28.0 ttl pk-yrs)    Types: Cigarettes   Smokeless tobacco: Never  Vaping Use   Vaping status: Never Used  Substance and Sexual Activity   Alcohol use: Never   Drug use: Never   Sexual activity: Not on file  Other Topics Concern   Not on file  Social History Narrative   Not on file   Social Drivers of Health   Tobacco Use: High Risk (05/23/2024)   Patient History    Smoking Tobacco Use: Every Day    Smokeless Tobacco Use: Never    Passive Exposure: Not on file  Financial  Resource Strain: Not on file  Food Insecurity: Not on file  Transportation Needs: Not on file  Physical Activity: Not on file  Stress: Not on file  Social Connections: Not on file  Intimate Partner Violence: Not on file  Depression (EYV7-0): Not on file  Alcohol Screen: Not on file  Housing: Not on file  Utilities: Not on file  Health Literacy: Not on file      Vitals:   05/23/24 1253 05/23/24 1331  BP:  (!) 178/82  Pulse:  92  Resp:  17  Temp: 98.7 F (37.1 C) 99 F (37.2 C)  SpO2:  93%    Physical Exam: GEN: NAD LLE: Left hip skin intact. No erythema or induration noted. ROM deferred. LLE shortened and externally rotated. SILT DP/SP/T, 5/5 EHL/PF/DF, +2 DP   RADIOLOGY: X rays of the left hip and femur reveal displaced left intertrochanteric hip fracture.  CT scan of left hip reveal displaced left intertrochanteric hip fracture  ASSESSMENT: Left intertrochanteric hip fracture  PLAN:  Discussed nature of injury at length with the patient and her daughter. After discussing the surgery, risks, and benefits the patients daughter consented to cephalomedullary nailing of the left hip. Will proceed as planned.  2:35 PM 05/23/24     [  1]  No current facility-administered medications on file prior to encounter.   Current Outpatient Medications on File Prior to Encounter  Medication Sig Dispense Refill   donepezil  (ARICEPT ) 10 MG tablet Take 10 mg by mouth at bedtime.     ibuprofen (ADVIL,MOTRIN) 200 MG tablet Take 400 mg by mouth every 6 (six) hours as needed for headache or moderate pain.     levothyroxine  (SYNTHROID , LEVOTHROID) 75 MCG tablet Take 75 mcg by mouth daily before breakfast.   5   losartan -hydrochlorothiazide (HYZAAR) 50-12.5 MG tablet Take 1 tablet by mouth daily.  5   mirtazapine  (REMERON ) 15 MG tablet Take 15 mg by mouth at bedtime.     potassium chloride  (K-DUR) 10 MEQ tablet Take 10 mEq by mouth 2 (two) times daily.  4

## 2024-05-23 NOTE — Anesthesia Procedure Notes (Signed)
 Procedure Name: Intubation Date/Time: 05/23/2024 5:01 PM  Performed by: Rosine Shona Jansky, CRNAPre-anesthesia Checklist: Patient identified, Emergency Drugs available, Suction available and Patient being monitored Patient Re-evaluated:Patient Re-evaluated prior to induction Oxygen Delivery Method: Circle system utilized Preoxygenation: Pre-oxygenation with 100% oxygen Induction Type: IV induction Ventilation: Mask ventilation without difficulty Laryngoscope Size: McGrath and 3 Grade View: Grade I Tube type: Oral Tube size: 6.0 mm Number of attempts: 1 Airway Equipment and Method: Stylet and Oral airway Placement Confirmation: ETT inserted through vocal cords under direct vision, positive ETCO2 and breath sounds checked- equal and bilateral Secured at: 22 cm Tube secured with: Tape Dental Injury: Teeth and Oropharynx as per pre-operative assessment

## 2024-05-23 NOTE — ED Notes (Signed)
 Socks, bed alarm and fall risk bracelet on. Stretcher locked and in the lowest position. Call bell within reach

## 2024-05-23 NOTE — Assessment & Plan Note (Signed)
 No acute issues suspected

## 2024-05-23 NOTE — Assessment & Plan Note (Addendum)
 Suspect reactive secondary to fall Patient had a cough Follow-up chest x-ray and will get respiratory viral panel Will also get UA

## 2024-05-24 LAB — BASIC METABOLIC PANEL WITH GFR
Anion gap: 10 (ref 5–15)
BUN: 15 mg/dL (ref 8–23)
CO2: 26 mmol/L (ref 22–32)
Calcium: 8.4 mg/dL — ABNORMAL LOW (ref 8.9–10.3)
Chloride: 98 mmol/L (ref 98–111)
Creatinine, Ser: 0.81 mg/dL (ref 0.44–1.00)
GFR, Estimated: >60 mL/min
Glucose, Bld: 177 mg/dL — ABNORMAL HIGH (ref 70–99)
Potassium: 4.7 mmol/L (ref 3.5–5.1)
Sodium: 134 mmol/L — ABNORMAL LOW (ref 135–145)

## 2024-05-24 LAB — CBC
HCT: 38.2 % (ref 36.0–46.0)
Hemoglobin: 12.6 g/dL (ref 12.0–15.0)
MCH: 31 pg (ref 26.0–34.0)
MCHC: 33 g/dL (ref 30.0–36.0)
MCV: 93.9 fL (ref 80.0–100.0)
Platelets: 202 10*3/uL (ref 150–400)
RBC: 4.07 MIL/uL (ref 3.87–5.11)
RDW: 14.4 % (ref 11.5–15.5)
WBC: 11.2 10*3/uL — ABNORMAL HIGH (ref 4.0–10.5)
nRBC: 0 % (ref 0.0–0.2)

## 2024-05-24 MED ORDER — HYDROMORPHONE HCL 1 MG/ML IJ SOLN
INTRAMUSCULAR | Status: AC
  Filled 2024-05-24: qty 1

## 2024-05-24 MED ORDER — MIRTAZAPINE 15 MG PO TABS
15.0000 mg | ORAL_TABLET | Freq: Every day | ORAL | Status: AC
  Administered 2024-05-24 – 2024-05-26 (×3): 15 mg via ORAL
  Filled 2024-05-24 (×3): qty 1

## 2024-05-24 MED ORDER — ADULT MULTIVITAMIN W/MINERALS CH
1.0000 | ORAL_TABLET | Freq: Every day | ORAL | Status: AC
  Administered 2024-05-24 – 2024-05-26 (×3): 1 via ORAL
  Filled 2024-05-24 (×3): qty 1

## 2024-05-24 MED ORDER — ENOXAPARIN SODIUM 40 MG/0.4ML IJ SOSY: 40.0000 mg | PREFILLED_SYRINGE | INTRAMUSCULAR | Status: DC

## 2024-05-24 MED ORDER — REMIFENTANIL HCL 1 MG IV SOLR
INTRAVENOUS | Status: AC
  Filled 2024-05-24: qty 1000

## 2024-05-24 MED ORDER — PROPOFOL 1000 MG/100ML IV EMUL
INTRAVENOUS | Status: AC
  Filled 2024-05-24: qty 100

## 2024-05-24 MED ORDER — KETAMINE HCL 50 MG/5ML IJ SOSY
PREFILLED_SYRINGE | INTRAMUSCULAR | Status: AC
  Filled 2024-05-24: qty 5

## 2024-05-24 MED ORDER — NICOTINE 21 MG/24HR TD PT24
21.0000 mg | MEDICATED_PATCH | Freq: Every day | TRANSDERMAL | Status: AC
  Administered 2024-05-24 – 2024-05-25 (×2): 21 mg via TRANSDERMAL
  Filled 2024-05-24 (×3): qty 1

## 2024-05-24 MED ORDER — FENTANYL CITRATE (PF) 100 MCG/2ML IJ SOLN
INTRAMUSCULAR | Status: AC
  Filled 2024-05-24: qty 2

## 2024-05-24 MED ORDER — ACETAMINOPHEN 10 MG/ML IV SOLN
INTRAVENOUS | Status: AC
  Filled 2024-05-24: qty 100

## 2024-05-24 MED ORDER — DONEPEZIL HCL 5 MG PO TABS
10.0000 mg | ORAL_TABLET | Freq: Every day | ORAL | Status: AC
  Administered 2024-05-24 – 2024-05-26 (×3): 10 mg via ORAL
  Filled 2024-05-24 (×3): qty 2

## 2024-05-24 NOTE — Progress Notes (Signed)
 Initial Nutrition Assessment  DOCUMENTATION CODES:   Not applicable  INTERVENTION:   -MVI with minerals daily -Continue with regular diet -Encourage adequate oral intake; family to provide outside food  NUTRITION DIAGNOSIS:   Moderate Malnutrition related to chronic illness (history of salivary gland cancer) as evidenced by mild fat depletion, moderate fat depletion, mild muscle depletion, moderate muscle depletion.  GOAL:   Patient will meet greater than or equal to 90% of their needs  MONITOR:   PO intake, Supplement acceptance  REASON FOR ASSESSMENT:   Consult Assessment of nutrition requirement/status, Hip fracture protocol  ASSESSMENT:   79 y.o. 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  Patient admitted with left hip fracture s/p fall.   2/3- s/p Cephalomedullary nailing left hip fracture   Reviewed I/O's: +125 ml x 24 hours  UOP: 500 ml x 24 hours  Spoke with patient and family member at bedside. Patient reports feeling better today. Family provided most of the history. Patient is a selective eater and is very regimented in what she eats at home. Per daughter, she consumes a very good breakfast and then intake is variable throughout the day. Breakfast is always the same: hashbrowns with cheese, crisp bacon, coffee, and a dead egg (fried eggs cooked to a crisp). Lunch is typically a sandwich and a cookie and dinner is usually beans and potatoes. Noted breakfast tray was 100% consumed, but daughter shared that she herself ate most of it, because patient stated her eggs and bacon were not crispy enough. She ate the toast and drank the coffee. Daughter states she plans to bring in outside food for patient to help supplement her intake.   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   Daughter shares that patient had experienced weight loss over the past few years due to poor oral intake, but was prescribed remeron , which helped stimulate her appetite. Per daughter, this has not caused weight gain, but has prevented her from losing weight. She also had poor thyroud function, but this has been stable for years. No recent weight history available to assess acute changes.   Discussed importance of good meal and supplement intake to promote healing. Patient does not like ice cream or supplements. She has tried the in the past and does not like them. Daughter to provide outside food.   Medications reviewed and include senokot.   Labs reviewed: Na: 134.    NUTRITION - FOCUSED PHYSICAL EXAM:  Flowsheet Row Most Recent Value  Orbital Region Mild depletion  Upper Arm Region Mild depletion  Thoracic and Lumbar Region No depletion  Buccal Region Mild depletion  Temple Region Mild depletion  Clavicle Bone Region Moderate depletion  Clavicle and Acromion Bone Region Moderate depletion  Scapular Bone Region Moderate depletion  Dorsal Hand Mild depletion  Patellar Region Mild depletion  Anterior Thigh Region Mild depletion  Posterior Calf Region Mild depletion  Edema (RD Assessment) None  Hair Reviewed  Eyes Reviewed  Mouth Reviewed  Skin Reviewed  Nails Reviewed    Diet Order:   Diet Order  Diet regular Room service appropriate? Yes; Fluid consistency: Thin  Diet effective now                   EDUCATION NEEDS:   Education needs have been addressed  Skin:  Skin Assessment: Skin Integrity Issues: Skin Integrity Issues:: Incisions Incisions: clsoed left hip  Last BM:  Unknown  Height:   Ht Readings from Last 1 Encounters:  05/23/24 5' 1  (1.549 m)    Weight:   Wt Readings from Last 1 Encounters:  05/23/24 54.4 kg    Ideal Body Weight:  47.7 kg  BMI:  Body mass index is 22.67 kg/m.  Estimated Nutritional Needs:   Kcal:  1600-1800  Protein:  80-95 grams  Fluid:  1.6-1.8 L    Margery ORN, RD, LDN, CDCES Registered Dietitian III Certified Diabetes Care and Education Specialist If unable to reach this RD, please use RD Inpatient group chat on secure chat between hours of 8am-4 pm daily

## 2024-05-24 NOTE — Evaluation (Signed)
 Physical Therapy Evaluation Patient Details Name: Darlene Grimes MRN: [id number] DOB: 01-09-1946 Today's Date: 05/24/2024  History of Present Illness  Patient is a 79 year old female with fall. Found to have left intertrochanteric hip fracture, s/p IM rod. PMH: Low-grade carcinoma of the saliva gland (2019) s/p resection, hypothyroidism, hypertension  Clinical Impression  Patient is agreeable to PT evaluation. The patient is ambulatory without a device at home. She has supervision from family and lives with daughter who was at the bedside.  Today the patient required assistance with all mobility. She reports mild pain in the left hip. She was able to stand and take a few steps to the chair. Unable to progress mobility further due to fatigue and weakness. She is not at her baseline level of functional independence. Recommend to continue PT to maximize independence and facilitate return to prior level of function. Rehabilitation <3 hours/day recommended after this hospital stay.       If plan is discharge home, recommend the following: A lot of help with walking and/or transfers;A lot of help with bathing/dressing/bathroom;Assistance with cooking/housework;Assist for transportation;Help with stairs or ramp for entrance;Supervision due to cognitive status   Can travel by private vehicle   No    Equipment Recommendations Rolling walker (2 wheels)  Recommendations for Other Services       Functional Status Assessment Patient has had a recent decline in their functional status and demonstrates the ability to make significant improvements in function in a reasonable and predictable amount of time.     Precautions / Restrictions Precautions Precautions: Fall Recall of Precautions/Restrictions: Impaired Restrictions Weight Bearing Restrictions Per Provider Order: Yes LLE Weight Bearing Per Provider Order: Weight bearing as tolerated      Mobility  Bed Mobility Overal bed mobility: Needs  Assistance Bed Mobility: Supine to Sit     Supine to sit: Mod assist     General bed mobility comments: assistance for LLE support. intermittent trunk support provided    Transfers Overall transfer level: Needs assistance Equipment used: Rolling walker (2 wheels) Transfers: Bed to chair/wheelchair/BSC     Step pivot transfers: Mod assist       General transfer comment: cues for weight shifting, steadying, and rolling walker negotiation. further standing tolerance limited by pain    Ambulation/Gait             Pre-gait activities: weight shifting facilitation provided. General Gait Details: not attempted due to poor standing tolerance  Stairs            Wheelchair Mobility     Tilt Bed    Modified Rankin (Stroke Patients Only)       Balance Overall balance assessment: Needs assistance Sitting-balance support: Feet supported Sitting balance-Leahy Scale: Good     Standing balance support: Bilateral upper extremity supported Standing balance-Leahy Scale: Poor Standing balance comment: external support required to maintain standing balance                             Pertinent Vitals/Pain Pain Assessment Pain Assessment: Faces Faces Pain Scale: Hurts a little bit Pain Location: L hip with movement Pain Descriptors / Indicators: Discomfort Pain Intervention(s): Limited activity within patient's tolerance, Monitored during session, Repositioned    Home Living Family/patient expects to be discharged to:: Private residence Living Arrangements: Children Available Help at Discharge: Family;Available 24 hours/day Type of Home: House Home Access: Stairs to enter Entrance Stairs-Rails: Right;Left Entrance Stairs-Number of Steps: 6  Home Layout: One level Home Equipment: None      Prior Function Prior Level of Function : Independent/Modified Independent             Mobility Comments: Mod I short distance without device. family  provided frequent supervision for safety, always with stairs ADLs Comments: daughter provides set-up and intermittent assistance as needed     Extremity/Trunk Assessment   Upper Extremity Assessment Upper Extremity Assessment: Overall WFL for tasks assessed    Lower Extremity Assessment Lower Extremity Assessment: LLE deficits/detail LLE Deficits / Details: no knee buckling with weight bearing. patient is able to activate hip/knee/ankle movement       Communication   Communication Communication: No apparent difficulties    Cognition Arousal: Alert Behavior During Therapy: WFL for tasks assessed/performed   PT - Cognitive impairments: Memory, Attention, Initiation, Sequencing, Problem solving, Safety/Judgement                       PT - Cognition Comments: patient defers to her daughter for many questions. Following commands: Impaired Following commands impaired: Follows one step commands with increased time     Cueing Cueing Techniques: Verbal cues, Tactile cues, Visual cues     General Comments General comments (skin integrity, edema, etc.): daughter at bedside during session. daughter feels that patient will need short term rehab at discharge. Sp02 down to 85% on room air with activity. supplemental 02 on at start end of session.    Exercises     Assessment/Plan    PT Assessment Patient needs continued PT services  PT Problem List Decreased strength;Decreased range of motion;Decreased activity tolerance;Decreased balance;Decreased mobility;Decreased cognition;Decreased knowledge of use of DME;Decreased safety awareness;Pain       PT Treatment Interventions DME instruction;Gait training;Stair training;Functional mobility training;Therapeutic activities;Therapeutic exercise;Neuromuscular re-education;Balance training;Cognitive remediation;Patient/family education    PT Goals (Current goals can be found in the Care Plan section)  Acute Rehab PT Goals Patient  Stated Goal: to return home PT Goal Formulation: With patient Time For Goal Achievement: 06/07/24 Potential to Achieve Goals: Good    Frequency 7X/week     Co-evaluation               AM-PAC PT 6 Clicks Mobility  Outcome Measure Help needed turning from your back to your side while in a flat bed without using bedrails?: A Little Help needed moving from lying on your back to sitting on the side of a flat bed without using bedrails?: A Lot Help needed moving to and from a bed to a chair (including a wheelchair)?: A Lot Help needed standing up from a chair using your arms (e.g., wheelchair or bedside chair)?: A Lot Help needed to walk in hospital room?: A Lot Help needed climbing 3-5 steps with a railing? : A Lot 6 Click Score: 13    End of Session Equipment Utilized During Treatment: Gait belt Activity Tolerance: Patient tolerated treatment well Patient left: in bed;with call bell/phone within reach;with chair alarm set;with family/visitor present Nurse Communication: Mobility status PT Visit Diagnosis: Difficulty in walking, not elsewhere classified (R26.2);Other abnormalities of gait and mobility (R26.89)    Time: 8993-8960 PT Time Calculation (min) (ACUTE ONLY): 33 min   Charges:   PT Evaluation $PT Eval Moderate Complexity: 1 Mod PT Treatments $Therapeutic Activity: 8-22 mins PT General Charges $$ ACUTE PT VISIT: 1 Visit         Randine Essex, PT, MPT   Randine LULLA Essex 05/24/2024, 2:11 PM

## 2024-05-24 NOTE — Progress Notes (Signed)
 " PROGRESS NOTE    Darlene Grimes  FMW:[id number] DOB: [birth year] DOA: 05/23/2024 PCP: Corlis Honor BROCKS, MD (Inactive)    Brief Narrative:   79 y.o. 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  Daughter and caregiver at bedside contributes to history.  She was previously in her usual state of health. 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  Tolerated procedure well. 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  Postoperative day 1   Level of care: Med-Surg  Consultants:  Orthopedics  Procedures:  Left hip IM nail  Antimicrobials: None   Subjective: Seen and examined.  Sitting up on edge of bed.  About to work with physical therapy.  Pain well-controlled when stationary  Objective: Vitals:   05/24/24 0039 05/24/24 0439 05/24/24 0756 05/24/24 1059  BP: (!) 141/77 (!) 142/75 (!) 121/55 (!) 108/50  Pulse: 88 84 86 85  Resp: 18 17 16 17   Temp: 98 F (36.7 C) 97.6 F (36.4 C) 98.3 F (36.8 C) 98.3 F (36.8 C)  TempSrc:      SpO2: 99% 99% 91% 96%  Weight:      Height:        Intake/Output Summary (Last 24 hours) at 05/24/2024 1357 Last data filed at 05/24/2024 0925 Gross per 24 hour  Intake 627 ml  Output 500 ml  Net 127 ml   Filed Weights   05/23/24 1331  Weight: 54.4 kg    Examination:  General exam: Appears calm and comfortable  Respiratory system: Clear to auscultation. Respiratory effort normal. Cardiovascular system: S1-S2, RRR, no murmurs, no pedal edema Gastrointestinal system: Soft, NT/ND, normal bowel sounds Central nervous system: Alert and oriented. No focal neurological deficits. Extremities: Left hip surgical dressing CDI.  Gait not assessed Skin: No rashes, lesions or ulcers Psychiatry: Judgement and insight appear normal. Mood & affect appropriate.     Data Reviewed: I have personally reviewed following labs and imaging studies  CBC: Recent Labs  Lab 05/23/24 0401 05/24/24 0545  WBC 18.3* 11.2*  NEUTROABS 15.7*  --   HGB 14.4 12.6  HCT 43.1 38.2  MCV 93.7 93.9  PLT 249  202   Basic Metabolic Panel: Recent Labs  Lab 05/23/24 0401 05/24/24 0545  NA 138 134*  K 3.4* 4.7  CL 99 98  CO2 30 26  GLUCOSE 119* 177*  BUN 6* 15  CREATININE 0.64 0.81  CALCIUM 9.2 8.4*   GFR: Estimated Creatinine Clearance: 42.5 mL/min (by C-G formula based on SCr of 0.81 mg/dL). Liver Function Tests: Recent Labs  Lab  05/23/24 0401  AST 16  ALT 8  ALKPHOS 84  BILITOT 0.4  PROT 6.4*  ALBUMIN 3.4*   No results for input(s): LIPASE, AMYLASE in the last 168 hours. No results for input(s): AMMONIA in the last 168 hours. Coagulation Profile: No results for input(s): INR, PROTIME in the last 168 hours. Cardiac Enzymes: No results for input(s): CKTOTAL, CKMB, CKMBINDEX, TROPONINI in the last 168 hours. BNP (last 3 results) No results for input(s): PROBNP in the last 8760 hours. HbA1C: No results for input(s): HGBA1C in the last 72 hours. CBG: No results for input(s): GLUCAP in the last 168 hours. Lipid Profile: No results for input(s): CHOL, HDL, LDLCALC, TRIG, CHOLHDL, LDLDIRECT in the last 72 hours. Thyroid  Function Tests: No results for input(s): TSH, T4TOTAL, FREET4, T3FREE, THYROIDAB in the last 72 hours. Anemia Panel: No results for input(s): VITAMINB12, FOLATE, FERRITIN, TIBC, IRON, RETICCTPCT in the last 72 hours. Sepsis Labs: No results for input(s): PROCALCITON, LATICACIDVEN in the last 168 hours.  No results found for this or any previous visit (from the past 240 hours).       Radiology Studies: DG HIP UNILAT W OR W/O PELVIS 2-3 VIEWS LEFT Result Date: 05/23/2024 CLINICAL DATA:  Fracture, postop. EXAM: DG HIP (WITH OR WITHOUT PELVIS) 2-3V LEFT COMPARISON:  Preoperative imaging. FINDINGS: Femoral intramedullary nail with trans trochanteric and distal locking screw fixation traverse proximal femur fracture. Decreased displacement from preoperative imaging. Recent postsurgical change includes air and edema in the soft tissues. IMPRESSION: ORIF of proximal femur fracture, in improved alignment from preoperative imaging. Electronically Signed   By: Andrea Gasman M.D.   On: 05/23/2024 20:09   DG HIP UNILAT WITH PELVIS 2-3 VIEWS LEFT Result Date: 05/23/2024 CLINICAL DATA:  Elective surgery. EXAM: DG HIP (WITH OR WITHOUT PELVIS)  2-3V LEFT COMPARISON:  Preoperative radiograph FINDINGS: Six fluoroscopic spot views of the left hip submitted from the operating room. Femoral intramedullary nail with trans trochanteric and distal locking screw fixation traverse proximal femur fracture. Fluoroscopy time 57 seconds. Dose 9.52 mGy. IMPRESSION: Intraoperative fluoroscopy during left proximal femur fracture ORIF. Electronically Signed   By: Andrea Gasman M.D.   On: 05/23/2024 20:08   DG C-Arm 1-60 Min-No Report Result Date: 05/23/2024 Fluoroscopy was utilized by the requesting physician.  No radiographic interpretation.   DG FEMUR MIN 2 VIEWS LEFT Result Date: 05/23/2024 EXAM: 2 VIEW(S) XRAY OF THE LEFT FEMUR 05/23/2024 08:26:13 AM COMPARISON: Hip radiographs 05/23/2024. CLINICAL HISTORY: Closed left hip fracture (HCC). ICD10: [id number] Closed left hip fracture. FINDINGS: BONES AND JOINTS: Acute mildly displaced intertrochanteric fracture of left proximal femur. Degenerative changes of bilateral sacroiliac joints. No malalignment. SOFT TISSUES: Vascular calcifications. IMPRESSION: 1. Acute mildly displaced intertrochanteric fracture of the left proximal femur. 2. Degenerative changes of the bilateral sacroiliac joints. Electronically signed by: Ryan Salvage MD 05/23/2024 08:53 AM EST RP Workstation: [id number]   CT HIP LEFT WO CONTRAST Result Date: 05/23/2024 EXAM: CT OF THE LEFT HIP WITHOUT IV CONTRAST 05/23/2024 08:33:51 AM TECHNIQUE: CT of the left hip was performed without the administration of intravenous contrast. Multiplanar  reformatted images are provided for review. Automated exposure control, iterative reconstruction, and/or weight based adjustment of the mA/kV was utilized to reduce the radiation dose to as low as reasonably achievable. COMPARISON: None available. CLINICAL HISTORY: Hip fracture. FINDINGS: BONES: Acute intertrochanteric fracture of the left proximal femur with mild comminution including a linear nondisplaced  fracture component extending in the lower left posterior femoral neck region for example on images 56 through 65 of series 3. Mild spurring at the pubis. No aggressive appearing osseous abnormality or periostitis. SOFT TISSUE: Expected edema along fascial planes in the vicinity of the intertrochanteric fracture. No soft tissue mass. JOINT: Mild to moderate degenerative left hip arthropathy with loss of articular space and marginal spurring of the acetabulum and femoral head. No osseous erosions. INTRAPELVIC CONTENTS: Bilateral iliac artery and left common femoral artery atheromatous vascular calcification. Sigmoid colon diverticulosis. IMPRESSION: 1. Acute intertrochanteric fracture of the left proximal femur with mild comminution and a linear nondisplaced fracture component extending into the lower left posterior femoral neck region, with adjacent soft tissue edema. 2. Mild to moderate degenerative left hip arthropathy with joint space loss and marginal spurring of the acetabulum and femoral head. 3. Mild degenerative spurring at the pubis. 4. Atherosclerotic vascular calcifications of the bilateral iliac arteries and left common femoral artery. 5. Sigmoid colon diverticulosis. Electronically signed by: Ryan Salvage MD 05/23/2024 08:47 AM EST RP Workstation: [id number]   DG Chest Port 1 View Result Date: 05/23/2024 EXAM: 1 VIEW(S) XRAY OF THE CHEST 05/23/2024 04:07:49 AM COMPARISON: CT of the chest dated 05/04/2018. CLINICAL HISTORY: Cough; pre-operative. FINDINGS: LUNGS AND PLEURA: Patchy and streaky opacities are now present within the right lung periphery. The findings are likely infectious or inflammatory, but follow up is recommended to ensure resolution. No pleural effusion. No pneumothorax. HEART AND MEDIASTINUM: There is calcification within the aortic arch. No acute abnormality of the cardiac silhouette. BONES AND SOFT TISSUES: Prominent calcifications are present at the costochondral junctions of  the right rib cage. IMPRESSION: 1. Patchy and streaky opacities in the right lung periphery, likely infectious or inflammatory. Follow-up is recommended to ensure resolution. 2. Prominent calcifications at the costochondral junctions of the right rib cage. 3. Atherosclerotic calcification within the aortic arch. Electronically signed by: Evalene Coho MD 05/23/2024 05:09 AM EST RP Workstation: [id number]   DG Hip Unilat W or Wo Pelvis 2-3 Views Left Result Date: 05/23/2024 EXAM: 2 or 3 VIEW(S) XRAY OF THE PELVIS AND LEFT HIP 05/23/2024 03:33:00 AM COMPARISON: None available. CLINICAL HISTORY: Fall with left hip pain. FINDINGS: BONES AND JOINTS: SI joints are symmetric. Nondisplaced left femoral intertrochanteric fracture. Bilateral hips demonstrate normal alignment. The bones are diffusely osteopenic. LUMBAR SPINE: Lumbosacral degenerative change. SOFT TISSUES: Iliofemoral arterial calcifications. IMPRESSION: 1. Nondisplaced left femoral intertrochanteric fracture. 2. Diffuse osteopenia. 3. Iliofemoral arterial calcifications. Electronically signed by: Greig Pique MD 05/23/2024 03:39 AM EST RP Workstation: [id number]        Scheduled Meds:  aspirin  EC  81 mg Oral BID   donepezil   10 mg Oral QHS   levothyroxine   75 mcg Oral Q0600   losartan   50 mg Oral Daily   mirtazapine   15 mg Oral QHS   multivitamin with minerals  1 tablet Oral Daily   nicotine   21 mg Transdermal Daily   senna  1 tablet Oral Daily   Continuous Infusions:   LOS: 1 day     Calvin KATHEE Robson, MD [facility]   If 7PM-7AM, please contact night-coverage  05/24/2024, 1:57 PM   "

## 2024-05-25 MED ORDER — POLYETHYLENE GLYCOL 3350 17 G PO PACK
17.0000 g | PACK | Freq: Every day | ORAL | Status: AC
  Administered 2024-05-25 – 2024-05-26 (×2): 17 g via ORAL
  Filled 2024-05-25 (×2): qty 1

## 2024-05-25 MED ORDER — SENNOSIDES-DOCUSATE SODIUM 8.6-50 MG PO TABS
1.0000 | ORAL_TABLET | Freq: Two times a day (BID) | ORAL | Status: AC
  Administered 2024-05-25 – 2024-05-26 (×4): 1 via ORAL
  Filled 2024-05-25 (×4): qty 1

## 2024-05-25 MED ORDER — BISACODYL 10 MG RE SUPP: 10.0000 mg | Freq: Every day | RECTAL | Status: AC | PRN

## 2024-05-25 MED ORDER — FLEET ENEMA RE ENEM: 1.0000 | ENEMA | Freq: Every day | RECTAL | Status: AC | PRN

## 2024-05-25 NOTE — NC FL2 (Signed)
 " [place]  MEDICAID FL2 LEVEL OF CARE FORM     IDENTIFICATION  Patient Name: Darlene Grimes Birthdate: [birth year] Sex: female Admission Date (Current Location): 05/23/2024  [facility] and [place] Number:  Chiropodist and Address:  [facility], [address] [place], [place] [address]      Provider Number: [id number]  Attending Physician Name and Address:  Jhonny Calvin NOVAK, MD  Relative Name and Phone Number:       Current Level of Care:   Recommended Level of Care: Skilled Nursing Facility Prior Approval Number:    Date Approved/Denied:   PASRR Number: [id number]  Discharge Plan: SNF    Current Diagnoses: Patient Active Problem List   Diagnosis Date Noted   Hypertension 05/23/2024   Dementia (HCC) 05/23/2024   Closed left hip fracture, initial encounter (HCC) 05/23/2024   Leukocytosis 05/23/2024   Closed hip fracture (HCC) 05/23/2024   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) 02/18/2018   Postprocedural hypothyroidism 04/04/2014   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 01/24/2014    Orientation RESPIRATION BLADDER Height & Weight     Place, Self  Normal Incontinent Weight: 120 lb (54.4 kg) (daughter stated) Height:  5' 1 (154.9 cm) (daughter stated)  BEHAVIORAL SYMPTOMS/MOOD NEUROLOGICAL BOWEL NUTRITION STATUS      Continent Diet (Regular)  AMBULATORY STATUS COMMUNICATION OF NEEDS Skin   Limited Assist Verbally Surgical wounds (Surgical closed incison right hip)                       Personal Care Assistance Level of Assistance  Bathing, Feeding, Dressing Bathing Assistance: Limited assistance Feeding assistance: Independent Dressing Assistance: Limited assistance     Functional Limitations Info  Sight, Hearing, Speech Sight Info: Adequate Hearing Info: Adequate Speech Info: Adequate    SPECIAL CARE FACTORS FREQUENCY  PT (By licensed PT), OT (By licensed OT)     PT Frequency:  5x/week OT Frequency: 5x/week            Contractures Contractures Info: Present    Additional Factors Info  Code Status, Allergies Code Status Info: Full Allergies Info: NKA           Current Medications (05/25/2024):  This is the current hospital active medication list Current Facility-Administered Medications  Medication Dose Route Frequency Provider Last Rate Last Admin   aspirin  EC tablet 81 mg  81 mg Oral BID Mariah Thamas RAMAN, MD   81 mg at 05/25/24 0904   donepezil  (ARICEPT ) tablet 10 mg  10 mg Oral QHS Sreenath, Sudheer B, MD   10 mg at 05/24/24 2147   HYDROcodone -acetaminophen  (NORCO/VICODIN) 5-325 MG per tablet 1-2 tablet  1-2 tablet Oral Q6H PRN Mariah Thamas RAMAN, MD   1 tablet at 05/24/24 0929   levothyroxine  (SYNTHROID ) tablet 75 mcg  75 mcg Oral Q0600 Mariah Thamas RAMAN, MD   75 mcg at 05/25/24 0510   losartan  (COZAAR ) tablet 50 mg  50 mg Oral Daily Mariah Thamas RAMAN, MD   50 mg at 05/25/24 9096   menthol  (CEPACOL) lozenge 3 mg  1 lozenge Oral PRN Bigby, Thamas RAMAN, MD       Or   phenol (CHLORASEPTIC) mouth spray 1 spray  1 spray Mouth/Throat PRN Bigby, Thamas RAMAN, MD       methocarbamol  (ROBAXIN ) tablet 500 mg  500 mg Oral Q6H PRN Mariah Thamas RAMAN, MD   500 mg at 05/25/24 9090   Or   methocarbamol  (  ROBAXIN ) injection 500 mg  500 mg Intravenous Q6H PRN Bigby, Ulric S, MD       metoCLOPramide  (REGLAN ) tablet 5-10 mg  5-10 mg Oral Q8H PRN Bigby, Thamas RAMAN, MD       Or   metoCLOPramide  (REGLAN ) injection 5-10 mg  5-10 mg Intravenous Q8H PRN Bigby, Thamas RAMAN, MD       mirtazapine  (REMERON ) tablet 15 mg  15 mg Oral QHS Sreenath, Sudheer B, MD   15 mg at 05/24/24 2147   morphine  (PF) 2 MG/ML injection 2 mg  2 mg Intravenous Q3H PRN Mariah Thamas RAMAN, MD   2 mg at 05/23/24 1019   multivitamin with minerals tablet 1 tablet  1 tablet Oral Daily Sreenath, Sudheer B, MD   1 tablet at 05/25/24 9096   nicotine  (NICODERM CQ  - dosed in mg/24 hours) patch 21 mg  21 mg Transdermal Daily Sreenath, Sudheer B,  MD   21 mg at 05/24/24 1335   ondansetron  (ZOFRAN ) tablet 4 mg  4 mg Oral Q6H PRN Bigby, Thamas RAMAN, MD       Or   ondansetron  (ZOFRAN ) injection 4 mg  4 mg Intravenous Q6H PRN Bigby, Thamas RAMAN, MD       senna (SENOKOT) tablet 8.6 mg  1 tablet Oral Daily Mariah Thamas RAMAN, MD   8.6 mg at 05/25/24 9095     Discharge Medications: Please see discharge summary for a list of discharge medications.  Relevant Imaging Results:  Relevant Lab Results:   Additional Information SSN: [id number]  Alvaro Louder, LCSW     "

## 2024-05-25 NOTE — Progress Notes (Addendum)
 Physical Therapy Treatment Patient Details Name: Darlene Grimes MRN: [id number] DOB: [birth year] Today's Date: 05/25/2024   History of Present Illness Patient is a 79 year old female with fall. Found to have left intertrochanteric hip fracture, s/p IM rod. 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 Supportive daughter at bedside. Pt is alert and cooperative. Does present with some cognitive deficits however easily able to follow simple commands during session. Pt is overall limited by pain. She tolerated getting OOB, standing to RW, and taking a few very antalgic step to steps from EOB to recliner. BP prior to OOB activity 102/45(60) after standing BP elevated to 127/58. Pt does endorse severe pain with wt bearing. RN held pain meds due to BP concerns. Overall pt is progressing well but remains far from her baseline abilities. Recommend continued skilled PT at DC to maximize independence plus assisting pt back to PLOF.   If plan is discharge home, recommend the following: A lot of help with walking and/or transfers;A lot of help with bathing/dressing/bathroom;Assistance with cooking/housework;Assist for transportation;Help with stairs or ramp for entrance;Supervision due to cognitive status     Equipment Recommendations  Rolling walker (2 wheels)       Precautions / Restrictions Precautions Precautions: Fall Recall of Precautions/Restrictions: Impaired Restrictions Weight Bearing Restrictions Per Provider Order: Yes LLE Weight Bearing Per Provider Order: Weight bearing as tolerated     Mobility  Bed Mobility Overal bed mobility: Needs Assistance Bed Mobility: Supine to Sit  Supine to sit: Mod assist  General bed mobility comments: INcreased time required + mod assist to progress LEs to EOB from long sitting    Transfers Overall transfer level: Needs assistance Equipment  used: Rolling walker (2 wheels) Transfers: Sit to/from Stand Sit to Stand: Min assist, Mod assist, From elevated surface  General transfer comment: Min assist from elevated surfaces. Mod assist from elevated (standard elevation surfaces)      Ambulation/Gait Ambulation/Gait assistance: Min assist Gait Distance (Feet): 3 Feet Assistive device: Rolling walker (2 wheels) Gait Pattern/deviations: Step-to pattern, Antalgic Gait velocity: decreased  General Gait Details: Pt was able to take a few steps from EOB to recliner. Antalgic step to gait with vcs for posture correction and improved lateral wt shift.    Balance Overall balance assessment: Needs assistance Sitting-balance support: Feet supported Sitting balance-Leahy Scale: Good Sitting balance - Comments: no LOB while seated EOB with feet support   Standing balance support: Bilateral upper extremity supported, During functional activity, Reliant on assistive device for balance Standing balance-Leahy Scale: Fair Standing balance comment: external support required to maintain standing balance     Communication Communication Communication: No apparent difficulties  Cognition Arousal: Alert Behavior During Therapy: WFL for tasks assessed/performed   PT - Cognitive impairments: History of cognitive impairments   PT - Cognition Comments: Pt is A but does present with some cognitive deficits. Per daughter who is present, Pt is at baseline Following commands: Intact Following commands impaired: Follows one step commands with increased time, Follows multi-step commands inconsistently    Cueing Cueing Techniques: Verbal cues, Tactile cues         Pertinent Vitals/Pain Pain Assessment Pain Assessment: 0-10 Pain Location: L hip Pain Descriptors / Indicators: Aching, Discomfort Pain Intervention(s): Limited activity within patient's tolerance, Monitored during session, Premedicated before session, Repositioned    Home Living  Family/patient expects to be discharged to:: Private residence Living  Arrangements: Children Available Help at Discharge: Family;Available 24 hours/day Type of Home: House Home Access: Stairs to enter Entrance Stairs-Rails: Doctor, General Practice of Steps: 6   Home Layout: One level Home Equipment: None          PT Goals (current goals can now be found in the care plan section) Acute Rehab PT Goals Patient Stated Goal: get better Progress towards PT goals: Progressing toward goals    Frequency    7X/week       AM-PAC PT 6 Clicks Mobility   Outcome Measure  Help needed turning from your back to your side while in a flat bed without using bedrails?: A Little Help needed moving from lying on your back to sitting on the side of a flat bed without using bedrails?: A Lot Help needed moving to and from a bed to a chair (including a wheelchair)?: A Lot Help needed standing up from a chair using your arms (e.g., wheelchair or bedside chair)?: A Lot Help needed to walk in hospital room?: A Lot Help needed climbing 3-5 steps with a railing? : A Lot 6 Click Score: 13    End of Session   Activity Tolerance: Patient tolerated treatment well Patient left: in chair;with call bell/phone within reach;with nursing/sitter in room;with family/visitor present Nurse Communication: Mobility status PT Visit Diagnosis: Difficulty in walking, not elsewhere classified (R26.2);Other abnormalities of gait and mobility (R26.89)     Time: 9149-9090 PT Time Calculation (min) (ACUTE ONLY): 19 min  Charges:    $Therapeutic Activity: 8-22 mins PT General Charges $$ ACUTE PT VISIT: 1 Visit                     Rankin Essex PTA 05/25/24, 12:46 PM

## 2024-05-25 NOTE — Evaluation (Signed)
 Occupational Therapy Evaluation Patient Details Name: Darlene Grimes MRN: [id number] DOB: [birth year] Today's Date: 05/25/2024   History of Present Illness   Patient is a 79 year old female with fall. Found to have left intertrochanteric hip fracture, s/p IM rod. 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 Prior to hospital admission, pt was Mod I/I in ADLs. Pt lives with children. Pt presents to acute OT demonstrating impaired ADL performance and functional mobility 2/2 decreased activity tolerance and functional strength/ROM/balance deficits. Pt currently requires CGA for doff/don socks sitting in chair. Good static sitting balace. Min a for sit<>stand with hand held assit from chair. Pt took ~3 steps with Min a + RW forward and back needing cueing for step clearance. Pt edu on safe use of DME. Pt limited by fatigue. Pt would benefit from skilled OT to address noted impairments and functional limitations (see below for any additional details). Upon hospital discharge, recommend continued OT services to maximize pt safety and return to PLOF.      If plan is discharge home, recommend the following:   A lot of help with walking and/or transfers;A little help with bathing/dressing/bathroom;Assistance with cooking/housework;Assist for transportation;Help with stairs or ramp for entrance     Functional Status Assessment   Patient has had a recent decline in their functional status and demonstrates the ability to make significant improvements in function in a reasonable and predictable amount of time.     Equipment Recommendations   Other (comment) (Defer)     Recommendations for Other Services         Precautions/Restrictions   Precautions Precautions: Fall Recall of Precautions/Restrictions: Impaired Restrictions Weight Bearing Restrictions Per Provider  Order: Yes LLE Weight Bearing Per Provider Order: Weight bearing as tolerated     Mobility Bed Mobility               General bed mobility comments: Not tested. Pt in chair when arriving    Transfers Overall transfer level: Needs assistance Equipment used: Rolling walker (2 wheels) Transfers: Sit to/from Stand Sit to Stand: Min assist                  Balance Overall balance assessment: Needs assistance Sitting-balance support: Feet supported Sitting balance-Leahy Scale: Good     Standing balance support: No upper extremity supported Standing balance-Leahy Scale: Poor                             ADL either performed or assessed with clinical judgement   ADL Overall ADL's : Needs assistance/impaired                                       General ADL Comments: Pt CGA for doff/don socks sitting in chair. Good static sitting balace. Min a for sit<>stand with hand held assit from chair. Pt took ~3 steps forward and back needing cueing for step clearence. Pt limited by fatigue. Pt edu on safe use of DME.     Vision         Perception         Praxis         Pertinent Vitals/Pain Pain Assessment Pain Assessment: 0-10 Pain Score: 3  Pain Descriptors / Indicators: Aching, Discomfort Pain  Intervention(s): Monitored during session     Extremity/Trunk Assessment Upper Extremity Assessment Upper Extremity Assessment: Overall WFL for tasks assessed   Lower Extremity Assessment Lower Extremity Assessment: LLE deficits/detail;Generalized weakness LLE Deficits / Details: Limited ROM due to pain       Communication Communication Communication: No apparent difficulties   Cognition Arousal: Alert Behavior During Therapy: WFL for tasks assessed/performed Cognition: Cognition impaired, History of cognitive impairments   Orientation impairments: Situation                           Following commands:  Impaired Following commands impaired: Follows one step commands with increased time     Cueing  General Comments   Cueing Techniques: Verbal cues;Tactile cues      Exercises     Shoulder Instructions      Home Living Family/patient expects to be discharged to:: Private residence Living Arrangements: Children Available Help at Discharge: Family;Available 24 hours/day Type of Home: House Home Access: Stairs to enter [employer] of Steps: 6 Entrance Stairs-Rails: Right;Left Home Layout: One level               Home Equipment: None          Prior Functioning/Environment Prior Level of Function : Independent/Modified Independent               ADLs Comments: daughter provides set-up and intermittent assistance as needed    OT Problem List: Decreased strength;Decreased range of motion;Decreased activity tolerance;Pain   OT Treatment/Interventions: Self-care/ADL training;Therapeutic exercise;DME and/or AE instruction;Energy conservation;Balance training;Patient/family education      OT Goals(Current goals can be found in the care plan section)   Acute Rehab OT Goals Patient Stated Goal: go home OT Goal Formulation: With patient Time For Goal Achievement: 06/08/24 Potential to Achieve Goals: Good ADL Goals Pt Will Perform Upper Body Bathing: with modified independence;standing Pt Will Perform Lower Body Dressing: with modified independence;sit to/from stand Pt Will Transfer to Toilet: with supervision;ambulating;regular height toilet   OT Frequency:  Min 2X/week    Co-evaluation              AM-PAC OT 6 Clicks Daily Activity     Outcome Measure Help from another person eating meals?: None Help from another person taking care of personal grooming?: A Little Help from another person toileting, which includes using toliet, bedpan, or urinal?: A Lot Help from another person bathing (including washing, rinsing, drying)?: A Lot Help from  another person to put on and taking off regular upper body clothing?: A Little Help from another person to put on and taking off regular lower body clothing?: A Little 6 Click Score: 17   End of Session Equipment Utilized During Treatment: Rolling walker (2 wheels);Gait belt  Activity Tolerance: Patient limited by fatigue Patient left: in chair;with call bell/phone within reach;with chair alarm set  OT Visit Diagnosis: Unsteadiness on feet (R26.81);Other abnormalities of gait and mobility (R26.89);Muscle weakness (generalized) (M62.81)                Time: 8967-8954 OT Time Calculation (min): 13 min Charges:  OT General Charges $OT Visit: 1 Visit OT Evaluation $OT Eval Low Complexity: 1 Low  Kyllian Clingerman OTS  Carthel Castille 05/25/2024, 11:26 AM

## 2024-05-25 NOTE — TOC Initial Note (Signed)
 Transition of Care [facility]) - Initial/Assessment Note    Patient Details  Name: Darlene Grimes MRN: [id number] Date of Birth: Nov 25, 1945  Transition of Care [facility]) CM/SW Contact:    Alvaro Louder, LCSW Phone Number: 05/25/2024, 9:34 AM  Clinical Narrative:     Per chart review patient is from home. PCP is Honor Cork LCSWA faxed out patient to SNF's in [place]. Patient has a preference for SNF [facility]. TOC to present facilities to patient at the bedside.                           TOC to follow for discharge        Patient Goals and CMS Choice            Expected Discharge Plan and Services                                              Prior Living Arrangements/Services                       Activities of Daily Living   ADL Screening (condition at time of admission) Independently performs ADLs?: No Does the patient have a NEW difficulty with bathing/dressing/toileting/self-feeding that is expected to last >3 days?: No Does the patient have a NEW difficulty with getting in/out of bed, walking, or climbing stairs that is expected to last >3 days?: Yes (Initiates electronic notice to provider for possible PT consult) Does the patient have a NEW difficulty with communication that is expected to last >3 days?: No Is the patient deaf or have difficulty hearing?: No Does the patient have difficulty seeing, even when wearing glasses/contacts?: No Does the patient have difficulty concentrating, remembering, or making decisions?: Yes  Permission Sought/Granted                  Emotional Assessment              Admission diagnosis:  Closed hip fracture (HCC) [S72.009A] Fall, initial encounter [W19.XXXA] Nondisplaced intertrochanteric fracture of left femur, initial encounter for closed fracture [facility]) [S72.145A] Patient Active Problem List   Diagnosis Date Noted   Hypertension 05/23/2024   Dementia (HCC) 05/23/2024   Closed left hip  fracture, initial encounter (HCC) 05/23/2024   Leukocytosis 05/23/2024   Closed hip fracture (HCC) 05/23/2024   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) 02/18/2018   Postprocedural hypothyroidism 04/04/2014   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 01/24/2014   PCP:  Cork Honor BROCKS, MD (Inactive) Pharmacy:   [facility] [address], [place] - [address] [place] [address] Phone: [phone] Fax: [phone]     Social Drivers of Health (SDOH) Social History: SDOH Screenings   Food Insecurity: No Food Insecurity (05/24/2024)  Housing: Low Risk (05/24/2024)  Transportation Needs: No Transportation Needs (05/24/2024)  Utilities: Not At Risk (05/24/2024)  Social Connections: Socially Isolated (05/24/2024)  Tobacco Use: High Risk (05/23/2024)   SDOH Interventions:     Readmission Risk Interventions     No data to display

## 2024-05-25 NOTE — Progress Notes (Signed)
 " PROGRESS NOTE    Darlene Grimes  FMW:[id number] DOB: 21-Dec-1945 DOA: 05/23/2024 PCP: Corlis Honor BROCKS, MD (Inactive)    Brief Narrative:   79 y.o. 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  Daughter and caregiver at bedside contributes to history.  She was previously in her usual state of health. In the ED slightly hypertensive to 167/73 with heart rate 90 Labs notable for leukocytosis of 18,000, CMP unremarkable EKG showing sinus rhythm at 85 X-ray hip with left intertrochanteric fracture  Status post intramedullary nailing with orthopedics on 2/3   Assessment & Plan:   Principal Problem:   Closed left hip fracture, initial encounter [facility]) Active Problems:   Leukocytosis   Postprocedural hypothyroidism   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)   Hypertension   Dementia (HCC)   Closed hip fracture (HCC)  Closed left hip fracture, initial encounter (HCC) Mechanical fall Status post intramedullary nailing with orthopedics on 2/3.  Tolerated procedure well. Plan: Continue PT OT Pain control Okay for diet Bowel regimen Pain control Aspirin  81 twice daily per orthopedics for VTE prophylaxis SNF search   Leukocytosis Suspect reactive secondary to fall Infectious workup negative   Dementia (HCC) Per daughter, patient has dementia-no prior documentation on record Delirium precautions   Hypertension Continue losartan    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) No acute issues suspected   Postprocedural hypothyroidism Continue levothyroxine       DVT prophylaxis: ASA 81mg  BID, SCD Code Status: FULL Family Communication: Daughter at bedside 2/4, 2/5 Disposition Plan: Status is: Inpatient Remains inpatient appropriate because: Hip fracture.   Postoperative day 2   Level of care: Med-Surg  Consultants:  Orthopedics  Procedures:  Left hip IM nail  Antimicrobials: None   Subjective: Seen and examined.  Sitting up on edge of bed.  About to work with physical therapy.  Pain well-controlled when stationary  Objective: Vitals:   05/24/24 1458 05/24/24 2032 05/25/24 0446 05/25/24 0800  BP: (!) 113/49 101/78 99/61 (!) 103/43  Pulse: 78 84 78 81  Resp: 17 16 17 17   Temp:  97.9 F (36.6 C) 97.7 F (36.5 C) 98 F (36.7 C)  TempSrc:    Oral  SpO2: 99% 97% 98% 97%  Weight:      Height:        Intake/Output Summary (Last 24 hours) at 05/25/2024 1210 Last data filed at 05/24/2024 1300 Gross per 24 hour  Intake 240 ml  Output --  Net 240 ml   Filed Weights   05/23/24 1331  Weight: 54.4 kg    Examination:  General exam: NAD Respiratory system: Clear.  Normal work of breathing.  Room air Cardiovascular system: S1-S2, RRR, no murmurs, no pedal edema Gastrointestinal system: Soft, NT/ND, normal bowel sounds Central nervous system: Alert and oriented. No focal neurological deficits. Extremities: Left hip surgical dressing CDI.  Gait not assessed Skin: No rashes, lesions or ulcers Psychiatry: Judgement and insight appear normal. Mood & affect appropriate.     Data Reviewed: I have personally reviewed following labs and imaging studies  CBC: Recent Labs  Lab 05/23/24 0401 05/24/24 0545  WBC 18.3* 11.2*  NEUTROABS 15.7*  --   HGB 14.4 12.6  HCT 43.1 38.2  MCV 93.7 93.9  PLT 249 202   Basic Metabolic Panel: Recent Labs  Lab 05/23/24 0401 05/24/24 0545  NA 138 134*  K 3.4* 4.7  CL 99 98  CO2 30 26  GLUCOSE 119* 177*  BUN 6* 15  CREATININE 0.64 0.81  CALCIUM 9.2 8.4*   GFR: Estimated Creatinine Clearance: 42.5 mL/min (by C-G formula based on SCr of 0.81 mg/dL). Liver Function Tests: Recent Labs  Lab 05/23/24 0401  AST 16  ALT 8  ALKPHOS 84  BILITOT 0.4  PROT 6.4*  ALBUMIN 3.4*   No  results for input(s): LIPASE, AMYLASE in the last 168 hours. No results for input(s): AMMONIA in the last 168 hours. Coagulation Profile: No results for input(s): INR, PROTIME in the last 168 hours. Cardiac Enzymes: No results for input(s): CKTOTAL, CKMB, CKMBINDEX, TROPONINI in the last 168 hours. BNP (last 3 results) No results for input(s): PROBNP in the last 8760 hours. HbA1C: No results for input(s): HGBA1C in the last 72 hours. CBG: No results for input(s): GLUCAP in the last 168 hours. Lipid Profile: No results for input(s): CHOL, HDL, LDLCALC, TRIG, CHOLHDL, LDLDIRECT in the last 72 hours. Thyroid  Function Tests: No results for input(s): TSH, T4TOTAL, FREET4, T3FREE, THYROIDAB in the last 72 hours. Anemia Panel: No results for input(s): VITAMINB12, FOLATE, FERRITIN, TIBC, IRON, RETICCTPCT in the last 72 hours. Sepsis Labs: No results for input(s): PROCALCITON, LATICACIDVEN in the last 168 hours.  No results found for this or any previous visit (from the past 240 hours).       Radiology Studies: DG HIP UNILAT W OR W/O PELVIS 2-3 VIEWS LEFT Result Date: 05/23/2024 CLINICAL DATA:  Fracture, postop. EXAM: DG HIP (WITH OR WITHOUT PELVIS) 2-3V LEFT COMPARISON:  Preoperative imaging. FINDINGS: Femoral intramedullary nail with trans trochanteric and distal locking screw fixation traverse proximal femur fracture. Decreased displacement from preoperative imaging. Recent postsurgical change includes air and edema in the soft tissues. IMPRESSION: ORIF of proximal femur fracture, in improved alignment from preoperative imaging. Electronically Signed   By: Andrea Gasman M.D.   On: 05/23/2024 20:09   DG HIP UNILAT WITH PELVIS 2-3 VIEWS LEFT Result Date: 05/23/2024 CLINICAL DATA:  Elective surgery. EXAM: DG HIP (WITH OR WITHOUT PELVIS) 2-3V LEFT COMPARISON:  Preoperative radiograph FINDINGS: Six fluoroscopic spot views of the left  hip submitted from the operating room. Femoral intramedullary nail with trans trochanteric and distal locking screw fixation traverse proximal femur fracture. Fluoroscopy time 57 seconds. Dose 9.52 mGy. IMPRESSION: Intraoperative fluoroscopy during left proximal femur fracture ORIF. Electronically Signed   By: Andrea Gasman M.D.   On: 05/23/2024 20:08   DG C-Arm 1-60 Min-No Report Result Date: 05/23/2024 Fluoroscopy was utilized by the requesting physician.  No radiographic interpretation.        Scheduled Meds:  aspirin  EC  81 mg Oral BID   donepezil   10 mg Oral QHS   levothyroxine   75 mcg Oral Q0600   losartan   50 mg Oral Daily   mirtazapine   15 mg Oral QHS   multivitamin with minerals  1 tablet Oral Daily   nicotine   21 mg Transdermal Daily   polyethylene glycol  17 g Oral Daily   senna-docusate  1 tablet Oral BID   Continuous Infusions:   LOS: 2 days     Calvin KATHEE Robson, MD [facility]   If 7PM-7AM, please contact night-coverage  05/25/2024, 12:10 PM   "

## 2024-05-25 NOTE — Plan of Care (Signed)
" °  Problem: Health Behavior/Discharge Planning: Goal: Ability to manage health-related needs will improve Outcome: Progressing   Problem: Clinical Measurements: Goal: Will remain free from infection Outcome: Progressing   Problem: Nutrition: Goal: Adequate nutrition will be maintained Outcome: Progressing   Problem: Coping: Goal: Level of anxiety will decrease Outcome: Progressing   Problem: Skin Integrity: Goal: Demonstrates signs of wound healing without infection Outcome: Progressing   Problem: Clinical Measurements: Goal: Postoperative complications will be avoided or minimized Outcome: Progressing   "

## 2024-05-26 NOTE — Plan of Care (Signed)
" °  Problem: Activity: Goal: Risk for activity intolerance will decrease Outcome: Progressing   Problem: Nutrition: Goal: Adequate nutrition will be maintained Outcome: Progressing   Problem: Elimination: Goal: Will not experience complications related to bowel motility Outcome: Progressing   Problem: Coping: Goal: Level of anxiety will decrease Outcome: Progressing   Problem: Safety: Goal: Ability to remain free from injury will improve Outcome: Progressing   Problem: Pain Managment: Goal: General experience of comfort will improve and/or be controlled Outcome: Progressing   "

## 2024-05-26 NOTE — Care Management Important Message (Signed)
 Important Message  Patient Details  Name: Darlene Grimes MRN: [id number] Date of Birth: 10-Sep-1945   Important Message Given:  Yes - Medicare IM     Kesler Wickham 05/26/2024, 12:33 PM

## 2024-05-26 NOTE — Progress Notes (Signed)
 " PROGRESS NOTE    Darlene Grimes  FMW:[id number] DOB: 01-04-1946 DOA: 05/23/2024 PCP: Corlis Honor BROCKS, MD (Inactive)    Brief Narrative:   79 y.o. 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  Daughter and caregiver at bedside contributes to history.  She was previously in her usual state of health. In the ED slightly hypertensive to 167/73 with heart rate 90 Labs notable for leukocytosis of 18,000, CMP unremarkable EKG showing sinus rhythm at 85 X-ray hip with left intertrochanteric fracture  Status post intramedullary nailing with orthopedics on 2/3   Assessment & Plan:   Principal Problem:   Closed left hip fracture, initial encounter [facility]) Active Problems:   Leukocytosis   Postprocedural hypothyroidism   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)   Hypertension   Dementia (HCC)   Closed hip fracture (HCC)  Closed left hip fracture, initial encounter (HCC) Mechanical fall Status post intramedullary nailing with orthopedics on 2/3.  Tolerated procedure well. BM reported 2/5 Plan: Continue PT OT Pain control Okay for diet Continue bowel regimen Aspirin  81 twice daily per orthopedics for VTE prophylaxis SNF search. Medically stable for discharge   Leukocytosis Suspect reactive secondary to fall Infectious workup negative   Dementia (HCC) Per daughter, patient has dementia-no prior documentation on record Delirium precautions   Hypertension Continue losartan    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) No acute issues suspected   Postprocedural hypothyroidism Continue levothyroxine       DVT prophylaxis: ASA 81mg  BID, SCD Code Status: FULL Family Communication: Daughter at bedside 2/4, 2/5, 2/6 Disposition Plan: Status is: Inpatient Remains  inpatient appropriate because: Hip fracture.  Postoperative day #3   Level of care: Med-Surg  Consultants:  Orthopedics  Procedures:  Left hip IM nail  Antimicrobials: None   Subjective: Seen and examined.  Sitting in chair.  Pain well-controlled  Objective: Vitals:   05/26/24 0302 05/26/24 0751 05/26/24 0800 05/26/24 1200  BP: (!) 112/57 (!) 101/53 (!) 91/51 (!) 123/58  Pulse: 75 74 75 80  Resp: 17 19 16 17   Temp: (!) 97.5 F (36.4 C) (!) 97.5 F (36.4 C) 97.9 F (36.6 C) 99.2 F (37.3 C)  TempSrc:   Oral Oral  SpO2: 93% 95% 95% 92%  Weight:      Height:        Intake/Output Summary (Last 24 hours) at 05/26/2024 1225 Last data filed at 05/25/2024 1300 Gross per 24 hour  Intake 0 ml  Output --  Net 0 ml   Filed Weights   05/23/24 1331  Weight: 54.4 kg    Examination:  General exam: No acute distress Respiratory system: Clear.  Normal work of breathing.  Room air Cardiovascular system: S1-S2, RRR, no murmurs, no pedal edema Gastrointestinal system: Soft, NT/ND, normal bowel sounds Central nervous system: Alert and oriented. No focal neurological deficits. Extremities: Left hip surgical dressing CDI.  Gait not assessed Skin: No rashes, lesions or ulcers Psychiatry: Judgement and insight appear normal. Mood & affect appropriate.     Data Reviewed: I have personally reviewed following labs and imaging studies  CBC: Recent Labs  Lab 05/23/24 0401 05/24/24 0545  WBC 18.3* 11.2*  NEUTROABS 15.7*  --   HGB 14.4 12.6  HCT 43.1 38.2  MCV 93.7 93.9  PLT 249 202   Basic Metabolic Panel: Recent Labs  Lab 05/23/24  0401 05/24/24 0545  NA 138 134*  K 3.4* 4.7  CL 99 98  CO2 30 26  GLUCOSE 119* 177*  BUN 6* 15  CREATININE 0.64 0.81  CALCIUM 9.2 8.4*   GFR: Estimated Creatinine Clearance: 42.5 mL/min (by C-G formula based on SCr of 0.81 mg/dL). Liver Function Tests: Recent Labs  Lab 05/23/24 0401  AST 16  ALT 8  ALKPHOS 84  BILITOT 0.4  PROT  6.4*  ALBUMIN 3.4*   No results for input(s): LIPASE, AMYLASE in the last 168 hours. No results for input(s): AMMONIA in the last 168 hours. Coagulation Profile: No results for input(s): INR, PROTIME in the last 168 hours. Cardiac Enzymes: No results for input(s): CKTOTAL, CKMB, CKMBINDEX, TROPONINI in the last 168 hours. BNP (last 3 results) No results for input(s): PROBNP in the last 8760 hours. HbA1C: No results for input(s): HGBA1C in the last 72 hours. CBG: No results for input(s): GLUCAP in the last 168 hours. Lipid Profile: No results for input(s): CHOL, HDL, LDLCALC, TRIG, CHOLHDL, LDLDIRECT in the last 72 hours. Thyroid  Function Tests: No results for input(s): TSH, T4TOTAL, FREET4, T3FREE, THYROIDAB in the last 72 hours. Anemia Panel: No results for input(s): VITAMINB12, FOLATE, FERRITIN, TIBC, IRON, RETICCTPCT in the last 72 hours. Sepsis Labs: No results for input(s): PROCALCITON, LATICACIDVEN in the last 168 hours.  No results found for this or any previous visit (from the past 240 hours).       Radiology Studies: No results found.       Scheduled Meds:  aspirin  EC  81 mg Oral BID   donepezil   10 mg Oral QHS   levothyroxine   75 mcg Oral Q0600   losartan   50 mg Oral Daily   mirtazapine   15 mg Oral QHS   multivitamin with minerals  1 tablet Oral Daily   nicotine   21 mg Transdermal Daily   polyethylene glycol  17 g Oral Daily   senna-docusate  1 tablet Oral BID   Continuous Infusions:   LOS: 3 days     Calvin KATHEE Robson, MD [facility]   If 7PM-7AM, please contact night-coverage  05/26/2024, 12:25 PM   "

## 2024-05-26 NOTE — Plan of Care (Signed)
   Problem: Health Behavior/Discharge Planning: Goal: Ability to manage health-related needs will improve Outcome: Progressing   Problem: Clinical Measurements: Goal: Will remain [place] from infection Outcome: Progressing   Problem: Activity: Goal: Risk for activity intolerance will decrease Outcome: Progressing   Problem: Nutrition: Goal: Adequate nutrition will be maintained Outcome: Progressing

## 2024-05-26 NOTE — Progress Notes (Signed)
 Physical Therapy Treatment Patient Details Name: Darlene Grimes MRN: [id number] DOB: December 09, 1945 Today's Date: 05/26/2024   History of Present Illness Patient is a 79 year old female with fall. Found to have left intertrochanteric hip fracture, s/p IM rod. 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 She is alert but does have some baseline cognition deficits. Supportive daughter present throughout. Pt was able to exit bed with min-mod assist of one, stand to RW and tolerate ambulation ~ 40 ft. No LOB however slow antalgic step to pattern. Pt remains STR/SNF appropriate to maximize independence and safety with all ADLs. Acute PT will continue to follow per current POC.   If plan is discharge home, recommend the following: A little help with walking and/or transfers;A lot of help with bathing/dressing/bathroom;Assistance with cooking/housework;Direct supervision/assist for medications management;Direct supervision/assist for financial management;Assist for transportation;Help with stairs or ramp for entrance;Supervision due to cognitive status     Equipment Recommendations  Other (comment) (Defer to next level of care)       Precautions / Restrictions Precautions Precautions: Fall Recall of Precautions/Restrictions: Impaired Restrictions Weight Bearing Restrictions Per Provider Order: Yes LLE Weight Bearing Per Provider Order: Weight bearing as tolerated     Mobility  Bed Mobility Overal bed mobility: Needs Assistance Bed Mobility: Supine to Sit  Supine to sit: Min assist, Mod assist   Transfers Overall transfer level: Needs assistance Equipment used: Rolling walker (2 wheels) Transfers: Sit to/from Stand Sit to Stand: Min assist   Ambulation/Gait Ambulation/Gait assistance: Contact guard assist, Min assist (chair follow) Gait Distance (Feet): 40 Feet Assistive device:  Rolling walker (2 wheels) Gait Pattern/deviations: Step-to pattern, Antalgic Gait velocity: decreased  General Gait Details: Pt was able to advance gait distance to ~ 40 ft with slow antalgic ste to pattern    Balance Overall balance assessment: Needs assistance Sitting-balance support: Feet supported Sitting balance-Leahy Scale: Good     Standing balance support: Bilateral upper extremity supported, During functional activity, Reliant on assistive device for balance Standing balance-Leahy Scale: Fair Standing balance comment: reliant on RW for all dyanmic standing activity. Poor wt acceptance on LLE due to pain however pt puts forth great effort throughout      Communication Communication Communication: No apparent difficulties  Cognition Arousal: Alert Behavior During Therapy: WFL for tasks assessed/performed   PT - Cognitive impairments: History of cognitive impairments    Following commands: Intact Following commands impaired: Follows one step commands with increased time    Cueing Cueing Techniques: Verbal cues, Tactile cues         Pertinent Vitals/Pain Pain Assessment Pain Assessment: 0-10 Pain Score: 1  Pain Location: L hip Pain Descriptors / Indicators: Aching, Discomfort Pain Intervention(s): Limited activity within patient's tolerance, Monitored during session, Repositioned, Ice applied     PT Goals (current goals can now be found in the care plan section) Acute Rehab PT Goals Patient Stated Goal: Get better so I can go home. Progress towards PT goals: Progressing toward goals    Frequency    7X/week       AM-PAC PT 6 Clicks Mobility   Outcome Measure  Help needed turning from your back to your side while in a flat bed without using bedrails?: A Little Help needed moving from lying on your back to sitting on the side of a flat bed without using bedrails?: A Lot Help needed moving to  and from a bed to a chair (including a wheelchair)?: A  Lot Help needed standing up from a chair using your arms (e.g., wheelchair or bedside chair)?: A Little Help needed to walk in hospital room?: A Little Help needed climbing 3-5 steps with a railing? : A Lot 6 Click Score: 15    End of Session Equipment Utilized During Treatment: Gait belt Activity Tolerance: Patient tolerated treatment well Patient left: in chair;with call bell/phone within reach;with nursing/sitter in room;with family/visitor present Nurse Communication: Mobility status PT Visit Diagnosis: Difficulty in walking, not elsewhere classified (R26.2);Other abnormalities of gait and mobility (R26.89)     Time: 9149-9085 PT Time Calculation (min) (ACUTE ONLY): 24 min  Charges:    $Gait Training: 8-22 mins $Therapeutic Activity: 8-22 mins PT General Charges $$ ACUTE PT VISIT: 1 Visit                    Rankin Essex PTA 05/26/24, 9:25 AM

## 2024-05-26 NOTE — Progress Notes (Signed)
 [facility] Post-Op Note   Patient name: Darlene Grimes MRN: [id number] DOB: 1946-02-28 Date: 05/26/2024  3 Days Post-Op s/p left hip intramedullary nailing for management of left intertrochanteric hip fracture (due to fall)  Subjective:  No acute events overnight. Patient resting comfortably in bed this morning. Denies new or worsening pain. Denies numbness/tingling. Denies systemic signs of illness including fever, chills, nausea, vomiting, chest pain, shortness of breath.   Visited patient yesterday evening 5pm and this morning 7:30am. Patient accompanied by daughter. Doing well. In good spirits. Good appetite; was eating dinner (baked potato and peas) yesterday evening and eating breakfast (eggs, bacon, hash browns, and toast - brought in from home) this morning.  Comfortable with no movement. Minimal pain when moving hip. No complaints. Feeling well. Ready to go home; daughter working with social work [facility] team for mother to go to facility here in [place], near [employer].  Vitals:   05/26/24 0751 05/26/24 0800  BP: (!) 101/53 (!) 91/51  Pulse: 74 75  Resp: 19 16  Temp: (!) 97.5 F (36.4 C) 97.9 F (36.6 C)  SpO2: 95% 95%    Physical Exam:  GEN: NAD, alert, awake, follows commands for exam  Left LE: Compartments soft and compressible Dressing clean, dry and intact. No bleeding. No surrounding erythema, edema, ecchymosis. No streaking redness. No tenderness with palpation. Pain-free passive short arc ROM of hip, knee, ankle. No pain with small log roll. Palpable dorsalis pedis/posterior tibialis pulses Brisk cap refill x 5 toes Sensation intact to light touch to Deep peroneal/Superficial peroneal/Tibial/Saphenous/Sural nerve distributions Motor function intact to EHL/FHL/Tibialis anterior/Gastrocnemius    Imaging: No new imaging since last visit CLINICAL DATA:  Fracture, postop. - 05/24/2023   EXAM: DG HIP (WITH OR WITHOUT PELVIS) 2-3V  LEFT   COMPARISON:  Preoperative imaging.   FINDINGS: Femoral intramedullary nail with trans trochanteric and distal locking screw fixation traverse proximal femur fracture. Decreased displacement from preoperative imaging. Recent postsurgical change includes air and edema in the soft tissues.   IMPRESSION: ORIF of proximal femur fracture, in improved alignment from preoperative imaging.   Assessment: 3 Days Post-Op s/p left hip intramedullary nailing for management of left intertrochanteric hip fracture (due to fall)  Plan: -Patient doing well post-operatively -Weightbearing status: WBAT, currently using rolling walker to ambulation assistance -PT/OT: Daily or bid visits -DVT ppx: 81mg  ASA bid x 6 weeks -Pain control: Vicodin -ABX: Received 24 hours of Ancef  -Disposition: Patient cleared by orthopedics to be discharged to rehab/SNF. Continues to be managed by hospitalist team and [facility] team to assist with discharge.  Thank you to everyone assisting with Ms. Hyndman's care.   Lucie Stabs, PA-C [facility] [place]  8:06 AM 05/26/24
